# Patient Record
Sex: Female | Born: 1965
Health system: Southern US, Community
[De-identification: ages and names within clinical notes are randomized; demographics above are authoritative.]

## PROBLEM LIST (undated history)

## (undated) DIAGNOSIS — M1990 Unspecified osteoarthritis, unspecified site: Secondary | ICD-10-CM

## (undated) DIAGNOSIS — I10 Essential (primary) hypertension: Secondary | ICD-10-CM

## (undated) DIAGNOSIS — R32 Unspecified urinary incontinence: Secondary | ICD-10-CM

## (undated) DIAGNOSIS — K3184 Gastroparesis: Secondary | ICD-10-CM

## (undated) DIAGNOSIS — K2100 Gastro-esophageal reflux disease with esophagitis, without bleeding: Secondary | ICD-10-CM

## (undated) DIAGNOSIS — Z860101 Personal history of adenomatous and serrated colon polyps: Secondary | ICD-10-CM

## (undated) DIAGNOSIS — R7301 Impaired fasting glucose: Secondary | ICD-10-CM

## (undated) DIAGNOSIS — J45909 Unspecified asthma, uncomplicated: Secondary | ICD-10-CM

## (undated) DIAGNOSIS — E7800 Pure hypercholesterolemia, unspecified: Secondary | ICD-10-CM

## (undated) DIAGNOSIS — E039 Hypothyroidism, unspecified: Secondary | ICD-10-CM

## (undated) DIAGNOSIS — Z86010 Personal history of colonic polyps: Secondary | ICD-10-CM

## (undated) DIAGNOSIS — R3129 Other microscopic hematuria: Secondary | ICD-10-CM

## (undated) DIAGNOSIS — Z8489 Family history of other specified conditions: Secondary | ICD-10-CM

## (undated) HISTORY — DX: Personal history of adenomatous and serrated colon polyps: Z86.0101

## (undated) HISTORY — DX: Unspecified osteoarthritis, unspecified site: M19.90

## (undated) HISTORY — DX: Gastroparesis: K31.84

## (undated) HISTORY — PX: LAPAROSCOPIC ASSISTED VAGINAL HYSTERECTOMY: SHX5398

## (undated) HISTORY — DX: Pure hypercholesterolemia, unspecified: E78.00

## (undated) HISTORY — DX: Unspecified asthma, uncomplicated: J45.909

## (undated) HISTORY — DX: Hypothyroidism, unspecified: E03.9

## (undated) HISTORY — DX: Other microscopic hematuria: R31.29

## (undated) HISTORY — DX: Personal history of colonic polyps: Z86.010

## (undated) HISTORY — DX: Unspecified urinary incontinence: R32

## (undated) HISTORY — DX: Gastro-esophageal reflux disease with esophagitis, without bleeding: K21.00

## (undated) HISTORY — DX: Gastro-esophageal reflux disease with esophagitis: K21.0

## (undated) HISTORY — DX: Impaired fasting glucose: R73.01

## (undated) HISTORY — DX: Essential (primary) hypertension: I10

---

## 2005-01-24 ENCOUNTER — Other Ambulatory Visit: Admission: RE | Admit: 2005-01-24 | Discharge: 2005-01-24 | Payer: Self-pay | Admitting: Family Medicine

## 2006-04-01 ENCOUNTER — Other Ambulatory Visit: Admission: RE | Admit: 2006-04-01 | Discharge: 2006-04-01 | Payer: Self-pay | Admitting: Family Medicine

## 2006-06-03 ENCOUNTER — Ambulatory Visit: Payer: Self-pay | Admitting: Internal Medicine

## 2006-06-04 ENCOUNTER — Ambulatory Visit: Payer: Self-pay | Admitting: Gastroenterology

## 2006-06-10 ENCOUNTER — Ambulatory Visit (HOSPITAL_COMMUNITY): Admission: RE | Admit: 2006-06-10 | Discharge: 2006-06-10 | Payer: Self-pay | Admitting: Internal Medicine

## 2006-07-15 ENCOUNTER — Ambulatory Visit: Payer: Self-pay | Admitting: Internal Medicine

## 2008-07-26 ENCOUNTER — Encounter: Admission: RE | Admit: 2008-07-26 | Discharge: 2008-07-26 | Payer: Self-pay | Admitting: Obstetrics and Gynecology

## 2009-09-12 IMAGING — MG MM DIAGNOSTIC BILATERAL
6 series · 6 of 6 positions shown · non-contrast
Comparison: none

CLINICAL DATA: Focal pain in the 2 o'clock position of the left
breast for 1 week.  The patient states that she felt a small lump
in this area.  The patient was able to express a small amount of
gray greenish discharge 1 week ago.  No spontaneous discharge.

DIGITAL DIAGNOSTIC  BILATERAL  MAMMOGRAM  WITH CAD AND LEFT BREAST
ULTRASOUND:

[R CC]
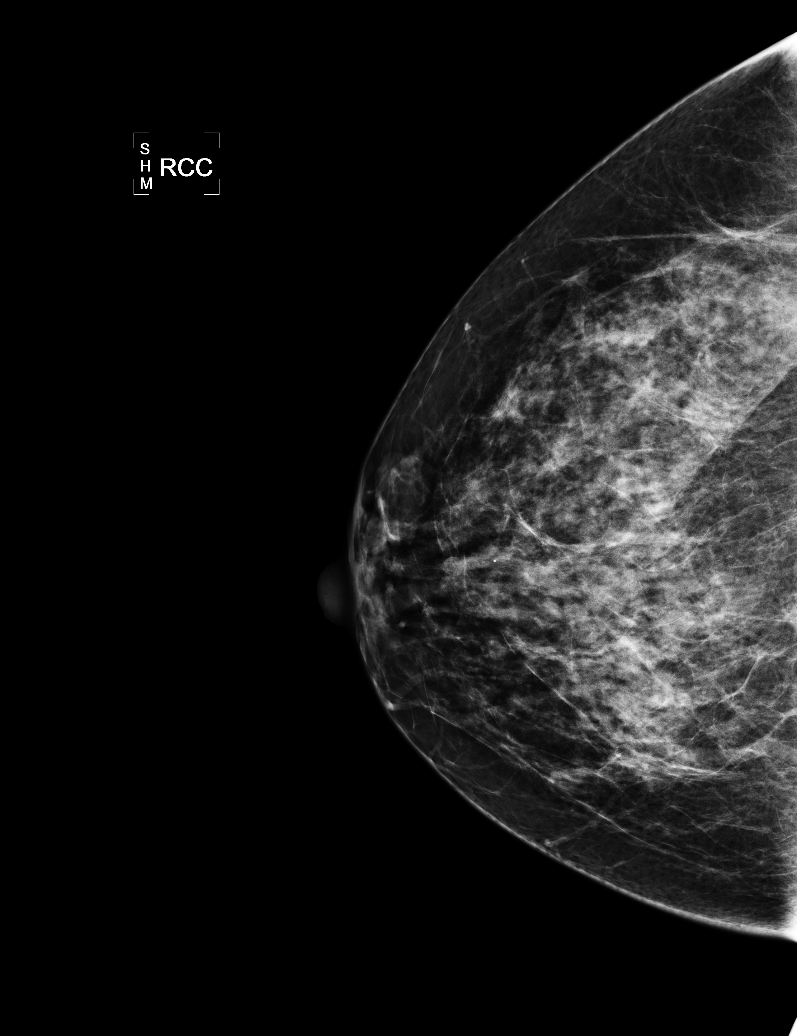

[L CC (1 of 2)]
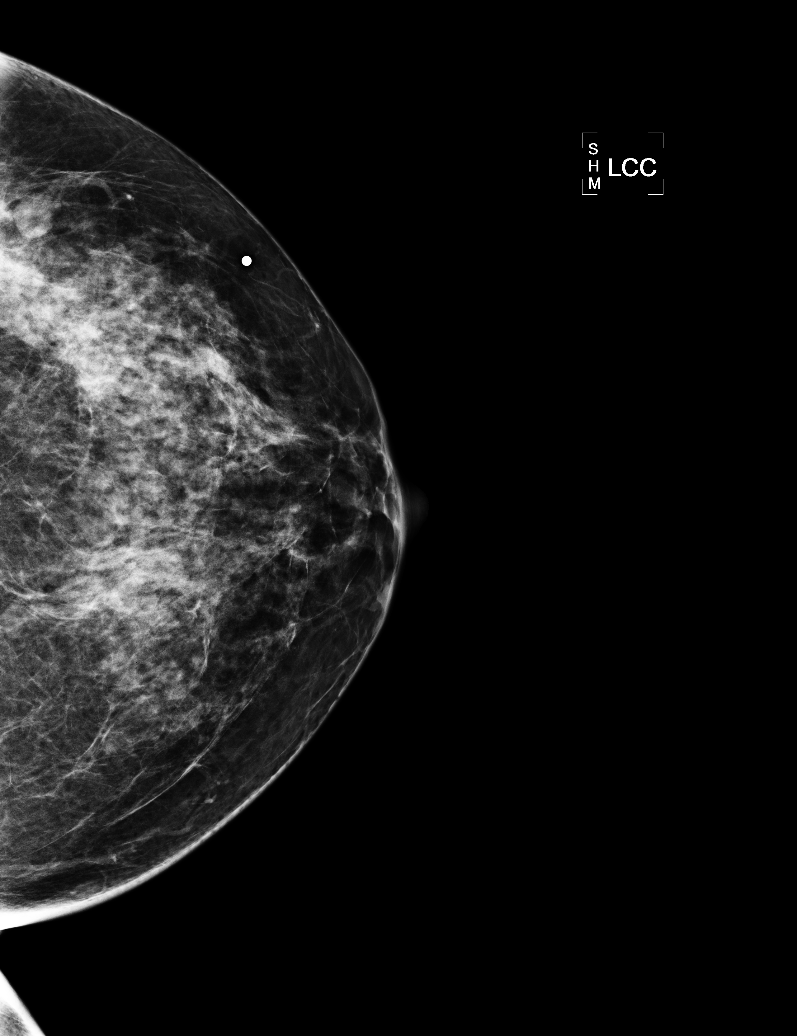

[L MLO (1 of 2)]
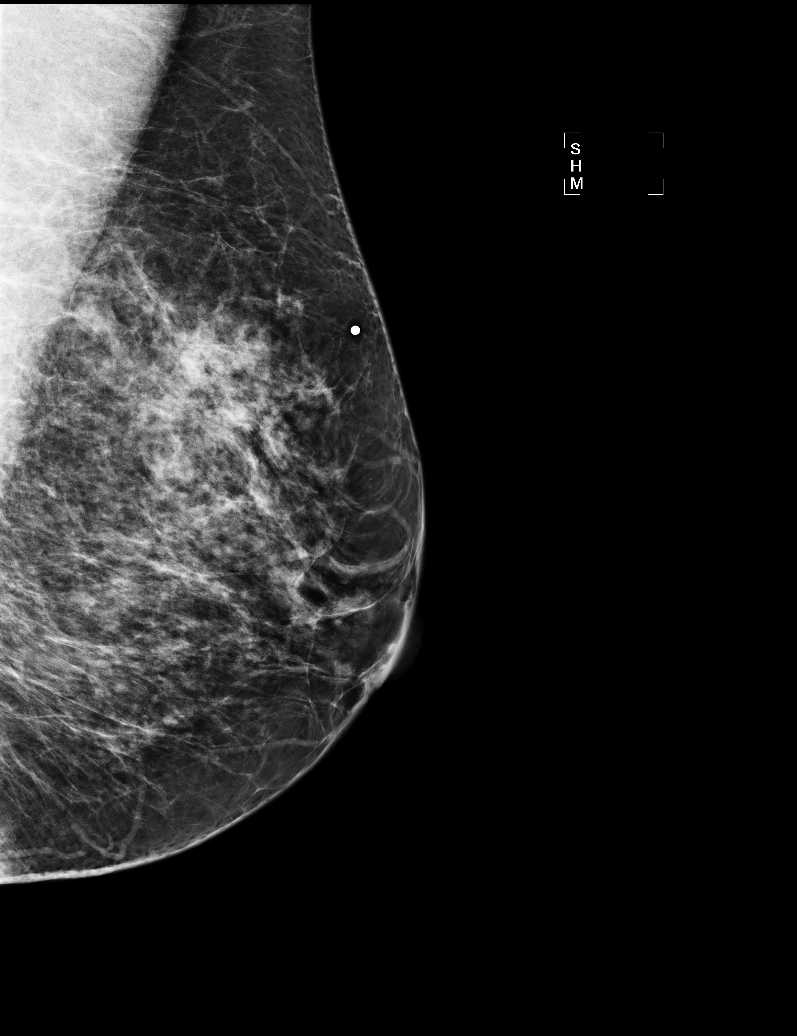

[R MLO]
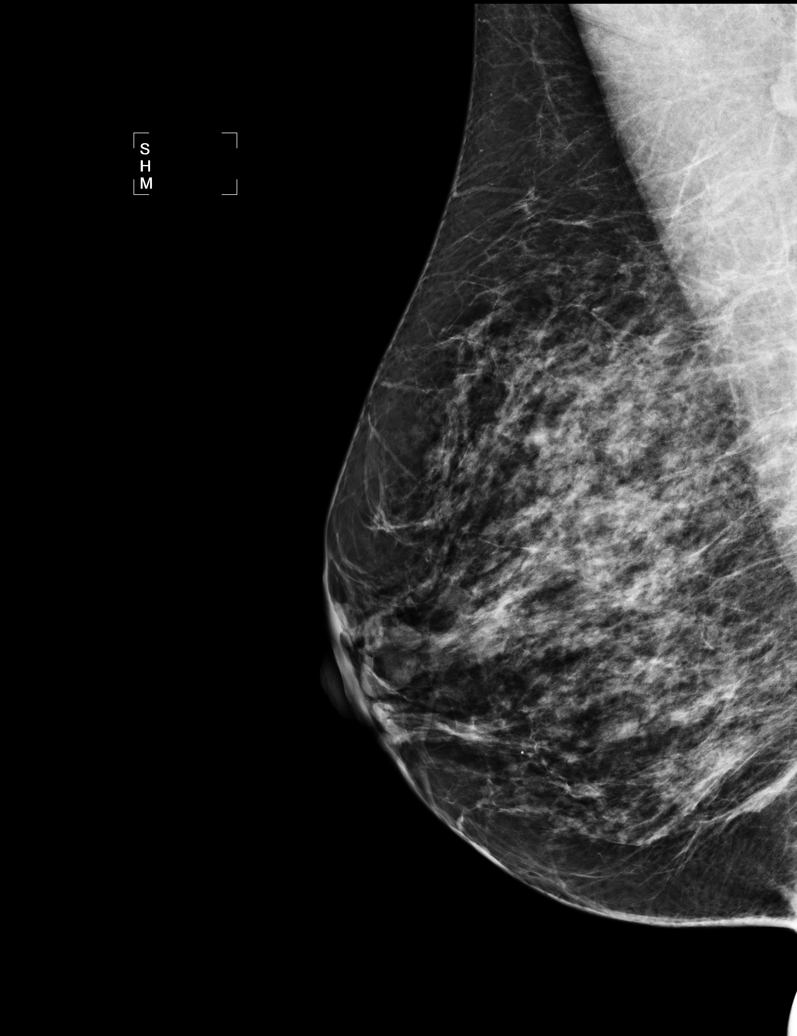

[L CC (2 of 2)]
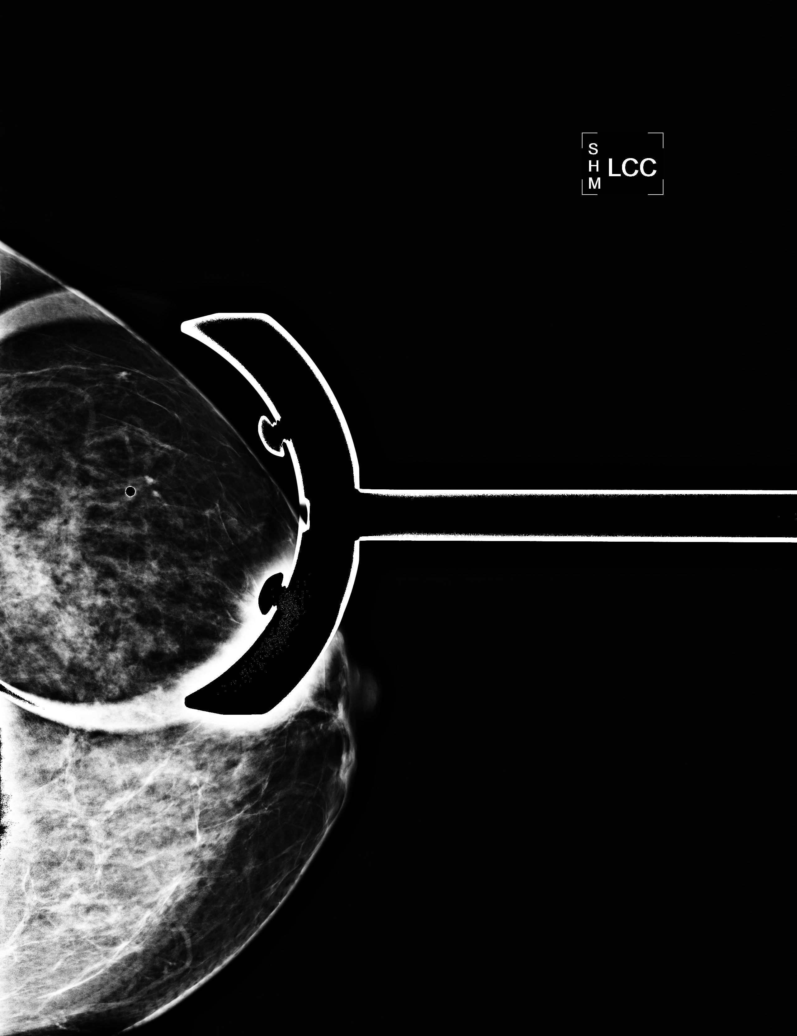

[L MLO (2 of 2)]
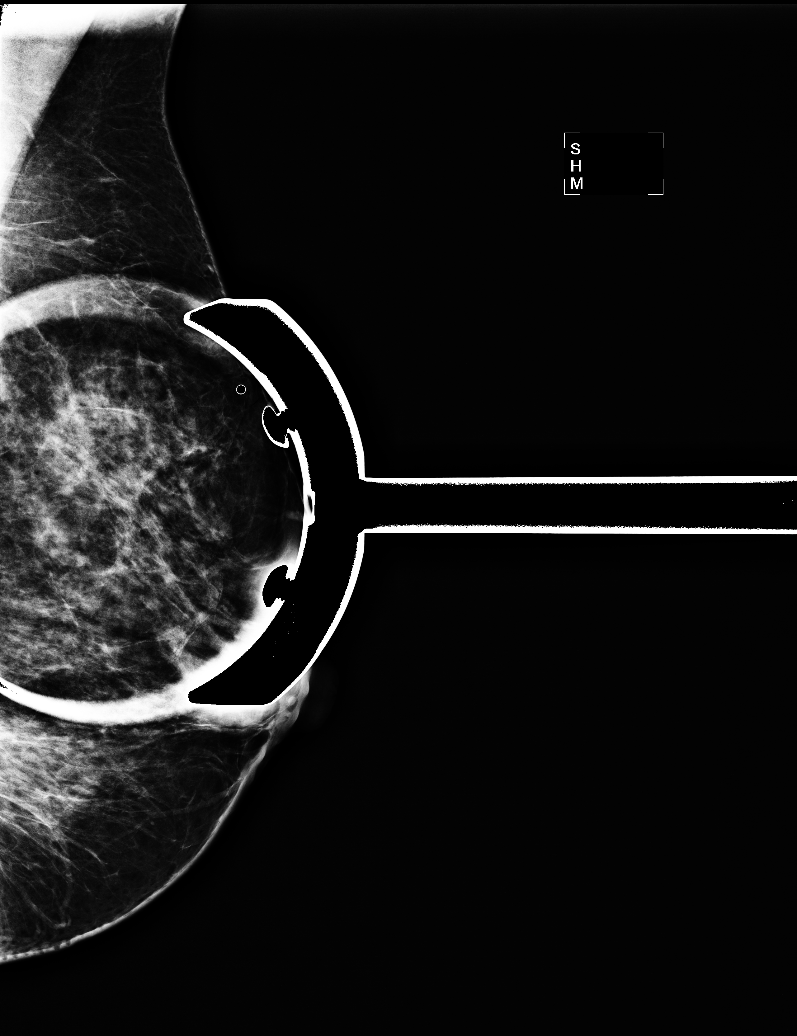

[6 of 6 positions shown; findings below may reference images not displayed]

FINDINGS: The breast tissue is heterogeneously dense.  There is no
dominant mass, architectural distortion or calcification to suggest
malignancy.

On physical exam, no mass is palpated in the left upper outer
quadrant.

Ultrasound is performed, showing dense fibroglandular tissue in the
area of concern at 2 o'clock 7 cm from the left nipple.  There are
subcentimeter cysts within this area but these are not palpable.
There is an intramammary lymph node at 2 o'clock, 10 cm from the
left nipple.
IMPRESSION: No mammographic or sonographic evidence of malignancy.  Yearly
screening mammography is suggested.

BI-RADS CATEGORY 1:  Negative.

## 2011-04-26 NOTE — Assessment & Plan Note (Signed)
[facility]                           GASTROENTEROLOGY OFFICE NOTE   NAME:Heather Buchanan, Heather Buchanan                     MRN:          [id number]  DATE:07/15/2006                            DOB:          [birth year]    See my medical history form for full details.   ASSESSMENT:  1.  Previous diagnosis of gastroparesis.  She presented with epigastric pain      and vomiting.  An ultrasound on June 04, 2006, was normal, and a gastric      emptying study here in [place] was normal as well.  She has been      relatively asymptomatic at this point.  There is a previous history of      sludge on gallbladder ultrasound.  2.  Glucose of 112 fasting on previous labs, could be prediabetes.  A twin      sister is on metformin.   RECOMMENDATIONS AND PLAN:  1.  Repeat a glucose, checking hemoglobin A1C today (these are nonfasting).      Check sprue labs, because she has had diarrhea in some of these issues.      Tissue transglutaminase antibody and IgA antibody.  2.  Return to see me as needed at this point.  We discussed the possibility      of a viral gastroparesis that resolved and improved with time, and also      that this is only one 2-hour study out of her life, and it could be that      she has gastroparesis that just was not manifested on this study.  3.  As far as treatment, she says she will pursue the domperidone rather      than the Reglan, though she could use either on an as-needed basis at      this point, as well as the Compazine I prescribed.  4.  Further plans pending clinical course.  She may need further workup of      possible diabetes, but I will see what these results show and try to      forward those on to Heather Stack, NP.                                   Iva Boop, MD, [facility] Graham   CEG/MedQ  DD:  07/15/2006  DT:  07/16/2006  Job #:  [id number]   cc:   Heather Stack, NP

## 2014-08-24 ENCOUNTER — Other Ambulatory Visit: Payer: Self-pay | Admitting: Obstetrics and Gynecology

## 2014-08-24 DIAGNOSIS — N6452 Nipple discharge: Secondary | ICD-10-CM

## 2014-08-24 DIAGNOSIS — N644 Mastodynia: Secondary | ICD-10-CM

## 2014-08-29 ENCOUNTER — Ambulatory Visit
Admission: RE | Admit: 2014-08-29 | Discharge: 2014-08-29 | Disposition: A | Discharge status: Home / Self Care | Payer: BC Managed Care – PPO | Source: Ambulatory Visit | Attending: Obstetrics and Gynecology | Admitting: Obstetrics and Gynecology

## 2014-08-29 DIAGNOSIS — N6452 Nipple discharge: Secondary | ICD-10-CM

## 2014-08-29 DIAGNOSIS — N644 Mastodynia: Secondary | ICD-10-CM

## 2014-08-31 ENCOUNTER — Inpatient Hospital Stay: Admission: RE | Admit: 2014-08-31 | Payer: Self-pay | Source: Ambulatory Visit

## 2014-08-31 ENCOUNTER — Other Ambulatory Visit: Payer: Self-pay

## 2016-06-07 ENCOUNTER — Other Ambulatory Visit: Payer: Self-pay | Admitting: Obstetrics and Gynecology

## 2016-06-07 DIAGNOSIS — N644 Mastodynia: Secondary | ICD-10-CM

## 2016-06-13 ENCOUNTER — Ambulatory Visit
Admission: RE | Admit: 2016-06-13 | Discharge: 2016-06-13 | Disposition: A | Discharge status: Home / Self Care | Payer: Commercial Managed Care - HMO | Source: Ambulatory Visit | Attending: Obstetrics and Gynecology | Admitting: Obstetrics and Gynecology

## 2016-06-13 DIAGNOSIS — N644 Mastodynia: Secondary | ICD-10-CM

## 2017-01-29 DIAGNOSIS — Z6828 Body mass index (BMI) 28.0-28.9, adult: Secondary | ICD-10-CM | POA: Diagnosis not present

## 2017-01-29 DIAGNOSIS — Z01419 Encounter for gynecological examination (general) (routine) without abnormal findings: Secondary | ICD-10-CM | POA: Diagnosis not present

## 2017-01-29 DIAGNOSIS — R319 Hematuria, unspecified: Secondary | ICD-10-CM | POA: Diagnosis not present

## 2017-02-03 DIAGNOSIS — R110 Nausea: Secondary | ICD-10-CM | POA: Diagnosis not present

## 2017-02-03 DIAGNOSIS — M545 Low back pain: Secondary | ICD-10-CM | POA: Diagnosis not present

## 2017-02-03 DIAGNOSIS — R310 Gross hematuria: Secondary | ICD-10-CM | POA: Diagnosis not present

## 2017-02-10 DIAGNOSIS — Z13228 Encounter for screening for other metabolic disorders: Secondary | ICD-10-CM | POA: Diagnosis not present

## 2017-02-10 DIAGNOSIS — Z131 Encounter for screening for diabetes mellitus: Secondary | ICD-10-CM | POA: Diagnosis not present

## 2017-02-10 DIAGNOSIS — E785 Hyperlipidemia, unspecified: Secondary | ICD-10-CM | POA: Diagnosis not present

## 2017-02-10 DIAGNOSIS — Z780 Asymptomatic menopausal state: Secondary | ICD-10-CM | POA: Diagnosis not present

## 2017-02-13 DIAGNOSIS — F4321 Adjustment disorder with depressed mood: Secondary | ICD-10-CM | POA: Diagnosis not present

## 2017-02-20 DIAGNOSIS — F4321 Adjustment disorder with depressed mood: Secondary | ICD-10-CM | POA: Diagnosis not present

## 2017-03-06 DIAGNOSIS — F4321 Adjustment disorder with depressed mood: Secondary | ICD-10-CM | POA: Diagnosis not present

## 2017-03-27 DIAGNOSIS — F4321 Adjustment disorder with depressed mood: Secondary | ICD-10-CM | POA: Diagnosis not present

## 2017-07-23 DIAGNOSIS — Z1231 Encounter for screening mammogram for malignant neoplasm of breast: Secondary | ICD-10-CM | POA: Diagnosis not present

## 2017-07-25 LAB — HM MAMMOGRAPHY

## 2017-08-04 DIAGNOSIS — F4323 Adjustment disorder with mixed anxiety and depressed mood: Secondary | ICD-10-CM | POA: Diagnosis not present

## 2017-08-18 DIAGNOSIS — F4323 Adjustment disorder with mixed anxiety and depressed mood: Secondary | ICD-10-CM | POA: Diagnosis not present

## 2017-09-01 DIAGNOSIS — F4323 Adjustment disorder with mixed anxiety and depressed mood: Secondary | ICD-10-CM | POA: Diagnosis not present

## 2017-10-13 DIAGNOSIS — F4323 Adjustment disorder with mixed anxiety and depressed mood: Secondary | ICD-10-CM | POA: Diagnosis not present

## 2017-11-03 DIAGNOSIS — F4323 Adjustment disorder with mixed anxiety and depressed mood: Secondary | ICD-10-CM | POA: Diagnosis not present

## 2017-11-20 DIAGNOSIS — F4323 Adjustment disorder with mixed anxiety and depressed mood: Secondary | ICD-10-CM | POA: Diagnosis not present

## 2018-01-01 DIAGNOSIS — F4323 Adjustment disorder with mixed anxiety and depressed mood: Secondary | ICD-10-CM | POA: Diagnosis not present

## 2018-01-29 DIAGNOSIS — F4323 Adjustment disorder with mixed anxiety and depressed mood: Secondary | ICD-10-CM | POA: Diagnosis not present

## 2018-02-10 DIAGNOSIS — E039 Hypothyroidism, unspecified: Secondary | ICD-10-CM | POA: Diagnosis not present

## 2018-02-26 DIAGNOSIS — F4323 Adjustment disorder with mixed anxiety and depressed mood: Secondary | ICD-10-CM | POA: Diagnosis not present

## 2018-04-02 DIAGNOSIS — F4323 Adjustment disorder with mixed anxiety and depressed mood: Secondary | ICD-10-CM | POA: Diagnosis not present

## 2018-04-15 DIAGNOSIS — Z6827 Body mass index (BMI) 27.0-27.9, adult: Secondary | ICD-10-CM | POA: Diagnosis not present

## 2018-04-15 DIAGNOSIS — Z01419 Encounter for gynecological examination (general) (routine) without abnormal findings: Secondary | ICD-10-CM | POA: Diagnosis not present

## 2018-04-15 LAB — LIPID PANEL
CHOLESTEROL: 228 — AB (ref 0–200)
HDL: 50 (ref 35–70)
LDL Cholesterol: 157
Triglycerides: 99 (ref 40–160)

## 2018-04-15 LAB — BASIC METABOLIC PANEL: Glucose: 102

## 2018-04-16 LAB — HM PAP SMEAR: HM Pap smear: NORMAL

## 2018-04-21 DIAGNOSIS — F4323 Adjustment disorder with mixed anxiety and depressed mood: Secondary | ICD-10-CM | POA: Diagnosis not present

## 2018-04-22 DIAGNOSIS — Z780 Asymptomatic menopausal state: Secondary | ICD-10-CM | POA: Diagnosis not present

## 2018-04-22 DIAGNOSIS — E0590 Thyrotoxicosis, unspecified without thyrotoxic crisis or storm: Secondary | ICD-10-CM | POA: Diagnosis not present

## 2018-05-05 DIAGNOSIS — Z1211 Encounter for screening for malignant neoplasm of colon: Secondary | ICD-10-CM | POA: Diagnosis not present

## 2018-05-09 HISTORY — PX: COLONOSCOPY: SHX174

## 2018-05-21 DIAGNOSIS — F4323 Adjustment disorder with mixed anxiety and depressed mood: Secondary | ICD-10-CM | POA: Diagnosis not present

## 2018-06-08 DIAGNOSIS — R3129 Other microscopic hematuria: Secondary | ICD-10-CM

## 2018-06-08 HISTORY — DX: Other microscopic hematuria: R31.29

## 2018-06-09 DIAGNOSIS — N393 Stress incontinence (female) (male): Secondary | ICD-10-CM | POA: Diagnosis not present

## 2018-06-09 DIAGNOSIS — R311 Benign essential microscopic hematuria: Secondary | ICD-10-CM | POA: Diagnosis not present

## 2018-06-23 DIAGNOSIS — R3129 Other microscopic hematuria: Secondary | ICD-10-CM | POA: Diagnosis not present

## 2018-06-23 DIAGNOSIS — R311 Benign essential microscopic hematuria: Secondary | ICD-10-CM | POA: Diagnosis not present

## 2018-07-06 ENCOUNTER — Encounter: Payer: Self-pay | Admitting: Family Medicine

## 2018-07-06 ENCOUNTER — Ambulatory Visit: Payer: BLUE CROSS/BLUE SHIELD | Admitting: Family Medicine

## 2018-07-06 VITALS — BP 156/81 | HR 88 | Temp 98.5°F | Resp 16 | Ht 61.75 in | Wt 149.0 lb

## 2018-07-06 DIAGNOSIS — R7301 Impaired fasting glucose: Secondary | ICD-10-CM

## 2018-07-06 DIAGNOSIS — I10 Essential (primary) hypertension: Secondary | ICD-10-CM

## 2018-07-06 DIAGNOSIS — E663 Overweight: Secondary | ICD-10-CM

## 2018-07-06 DIAGNOSIS — E7800 Pure hypercholesterolemia, unspecified: Secondary | ICD-10-CM | POA: Diagnosis not present

## 2018-07-06 MED ORDER — LISINOPRIL 5 MG PO TABS: 5.0000 mg | ORAL_TABLET | Freq: Every day | ORAL | 0 refills | Status: DC

## 2018-07-06 NOTE — Patient Instructions (Signed)
DASH Eating Plan DASH stands for "Dietary Approaches to Stop Hypertension." The DASH eating plan is a healthy eating plan that has been shown to reduce high blood pressure (hypertension). It may also reduce your risk for type 2 diabetes, heart disease, and stroke. The DASH eating plan may also help with weight loss. What are tips for following this plan? General guidelines  Avoid eating more than 2,300 mg (milligrams) of salt (sodium) a day. If you have hypertension, you may need to reduce your sodium intake to 1,500 mg a day.  Limit alcohol intake to no more than 1 drink a day for nonpregnant women and 2 drinks a day for men. One drink equals 12 oz of beer, 5 oz of wine, or 1 oz of hard liquor.  Work with your health care provider to maintain a healthy body weight or to lose weight. Ask what an ideal weight is for you.  Get at least 30 minutes of exercise that causes your heart to beat faster (aerobic exercise) most days of the week. Activities may include walking, swimming, or biking.  Work with your health care provider or diet and nutrition specialist (dietitian) to adjust your eating plan to your individual calorie needs. Reading food labels  Check food labels for the amount of sodium per serving. Choose foods with less than 5 percent of the Daily Value of sodium. Generally, foods with less than 300 mg of sodium per serving fit into this eating plan.  To find whole grains, look for the word "whole" as the first word in the ingredient list. Shopping  Buy products labeled as "low-sodium" or "no salt added."  Buy fresh foods. Avoid canned foods and premade or frozen meals. Cooking  Avoid adding salt when cooking. Use salt-free seasonings or herbs instead of table salt or sea salt. Check with your health care provider or pharmacist before using salt substitutes.  Do not fry foods. Cook foods using healthy methods such as baking, boiling, grilling, and broiling instead.  Cook with  heart-healthy oils, such as olive, canola, soybean, or sunflower oil. Meal planning   Eat a balanced diet that includes: ? 5 or more servings of fruits and vegetables each day. At each meal, try to fill half of your plate with fruits and vegetables. ? Up to 6-8 servings of whole grains each day. ? Less than 6 oz of lean meat, poultry, or fish each day. A 3-oz serving of meat is about the same size as a deck of cards. One egg equals 1 oz. ? 2 servings of low-fat dairy each day. ? A serving of nuts, seeds, or beans 5 times each week. ? Heart-healthy fats. Healthy fats called Omega-3 fatty acids are found in foods such as flaxseeds and coldwater fish, like sardines, salmon, and mackerel.  Limit how much you eat of the following: ? Canned or prepackaged foods. ? Food that is high in trans fat, such as fried foods. ? Food that is high in saturated fat, such as fatty meat. ? Sweets, desserts, sugary drinks, and other foods with added sugar. ? Full-fat dairy products.  Do not salt foods before eating.  Try to eat at least 2 vegetarian meals each week.  Eat more home-cooked food and less restaurant, buffet, and fast food.  When eating at a restaurant, ask that your food be prepared with less salt or no salt, if possible. What foods are recommended? The items listed may not be a complete list. Talk with your dietitian about what   dietary choices are best for you. Grains Whole-grain or whole-wheat bread. Whole-grain or whole-wheat pasta. Brown rice. Oatmeal. Quinoa. Bulgur. Whole-grain and low-sodium cereals. Pita bread. Low-fat, low-sodium crackers. Whole-wheat flour tortillas. Vegetables Fresh or frozen vegetables (raw, steamed, roasted, or grilled). Low-sodium or reduced-sodium tomato and vegetable juice. Low-sodium or reduced-sodium tomato sauce and tomato paste. Low-sodium or reduced-sodium canned vegetables. Fruits All fresh, dried, or frozen fruit. Canned fruit in natural juice (without  added sugar). Meat and other protein foods Skinless chicken or turkey. Ground chicken or turkey. Pork with fat trimmed off. Fish and seafood. Egg whites. Dried beans, peas, or lentils. Unsalted nuts, nut butters, and seeds. Unsalted canned beans. Lean cuts of beef with fat trimmed off. Low-sodium, lean deli meat. Dairy Low-fat (1%) or fat-free (skim) milk. Fat-free, low-fat, or reduced-fat cheeses. Nonfat, low-sodium ricotta or cottage cheese. Low-fat or nonfat yogurt. Low-fat, low-sodium cheese. Fats and oils Soft margarine without trans fats. Vegetable oil. Low-fat, reduced-fat, or light mayonnaise and salad dressings (reduced-sodium). Canola, safflower, olive, soybean, and sunflower oils. Avocado. Seasoning and other foods Herbs. Spices. Seasoning mixes without salt. Unsalted popcorn and pretzels. Fat-free sweets. What foods are not recommended? The items listed may not be a complete list. Talk with your dietitian about what dietary choices are best for you. Grains Baked goods made with fat, such as croissants, muffins, or some breads. Dry pasta or rice meal packs. Vegetables Creamed or fried vegetables. Vegetables in a cheese sauce. Regular canned vegetables (not low-sodium or reduced-sodium). Regular canned tomato sauce and paste (not low-sodium or reduced-sodium). Regular tomato and vegetable juice (not low-sodium or reduced-sodium). Pickles. Olives. Fruits Canned fruit in a light or heavy syrup. Fried fruit. Fruit in cream or butter sauce. Meat and other protein foods Fatty cuts of meat. Ribs. Fried meat. Bacon. Sausage. Bologna and other processed lunch meats. Salami. Fatback. Hotdogs. Bratwurst. Salted nuts and seeds. Canned beans with added salt. Canned or smoked fish. Whole eggs or egg yolks. Chicken or turkey with skin. Dairy Whole or 2% milk, cream, and half-and-half. Whole or full-fat cream cheese. Whole-fat or sweetened yogurt. Full-fat cheese. Nondairy creamers. Whipped toppings.  Processed cheese and cheese spreads. Fats and oils Butter. Stick margarine. Lard. Shortening. Ghee. Bacon fat. Tropical oils, such as coconut, palm kernel, or palm oil. Seasoning and other foods Salted popcorn and pretzels. Onion salt, garlic salt, seasoned salt, table salt, and sea salt. Worcestershire sauce. Tartar sauce. Barbecue sauce. Teriyaki sauce. Soy sauce, including reduced-sodium. Steak sauce. Canned and packaged gravies. Fish sauce. Oyster sauce. Cocktail sauce. Horseradish that you find on the shelf. Ketchup. Mustard. Meat flavorings and tenderizers. Bouillon cubes. Hot sauce and Tabasco sauce. Premade or packaged marinades. Premade or packaged taco seasonings. Relishes. Regular salad dressings. Where to find more information:  National Heart, Lung, and Blood Institute: www.nhlbi.nih.gov  American Heart Association: www.heart.org Summary  The DASH eating plan is a healthy eating plan that has been shown to reduce high blood pressure (hypertension). It may also reduce your risk for type 2 diabetes, heart disease, and stroke.  With the DASH eating plan, you should limit salt (sodium) intake to 2,300 mg a day. If you have hypertension, you may need to reduce your sodium intake to 1,500 mg a day.  When on the DASH eating plan, aim to eat more fresh fruits and vegetables, whole grains, lean proteins, low-fat dairy, and heart-healthy fats.  Work with your health care provider or diet and nutrition specialist (dietitian) to adjust your eating plan to your individual   calorie needs. This information is not intended to replace advice given to you by your health care provider. Make sure you discuss any questions you have with your health care provider. Document Released: 11/14/2011 Document Revised: 11/18/2016 Document Reviewed: 11/18/2016 Elsevier Interactive Patient Education  2018 Elsevier Inc.  

## 2018-07-06 NOTE — Progress Notes (Addendum)
Office Note 07/06/2018  CC:  Chief Complaint  Patient presents with  . Establish Care    Previous PCP: None  . Hypertension    HPI:  Heather Buchanan is a 52 y.o.  female who is here to establish care and discuss high blood pressure. Patient's most recent primary MD: none (has been seeing gYN Dr. Rana Snare, though). Old records were not reviewed prior to or during today's visit.  Has noted blood pressure elevated at GYN visits last couple of years.  Sounds like mildly elevated fasting gluc's and borderline hyperlipid numbers as well.  She recalls being told by Dr. Rana Snare that she needs to establish with a PCP to get these things addressed. She brought in recent [facility] labs done through employer about 2-3 mo ago: gluc 102, T chol 228, LDL 157, Trigs 99, HDL 50, chol/hdl ratio 4.6.  BP 140/86.  Never on bp med.  Two cups coffee each morning.  OTC decongestant only as needed. +FH HTN.  Feels hot and with HA when bp is up.  Lots of stress in home life, separation.  Also feeling menopausal hot flashes. Checking bp at home and work: 140s/90s is highest she's seen.   Exercise: none, but she is not sedentary. Diet: avoids sodium.  Fast food 2 x/mo, no fried/fatty foods, eats pretty healthy. Lots of grains.  Also, has had microhematuria recently at GYN, and has just seen a urologist, has f/u with Dr. Alvester Morin tomorrow. She got CT scan 2 wks ago and will discuss results tomorrow.  Past Medical History:  Diagnosis Date  . Arthritis   . Asthmatic bronchitis    "every winter"  . Gastroparesis    dietary changes help.  Stress worsens it.  Marland Kitchen GERD with esophagitis    2003 bx.  Gastric--no H pylori. SMall bowel and rectum bx: mild inflammatory cell infiltrate but no other abnormalities:   . History of adenomatous polyp of colon    Most recent colonoscopy normal 05/2018--recall 10 yrs per pt (Dr. Kinnie Scales).  . Hypercholesteremia   . Hypertension   . Hypothyroidism   . Prediabetes   . Urine  incontinence     Past Surgical History:  Procedure Laterality Date  . CESAREAN SECTION  2003 and 2004  . COLONOSCOPY  05/2018    Family History  Problem Relation Age of Onset  . Arthritis Mother   . Hyperlipidemia Mother   . Hypertension Mother   . Hearing loss Father   . Depression Sister   . Bipolar disorder Sister     Social History   Socioeconomic History  . Marital status: Married    Spouse name: Not on file  . Number of children: Not on file  . Years of education: Not on file  . Highest education level: Not on file  Occupational History  . Not on file  Social Needs  . Financial resource strain: Not on file  . Food insecurity:    Worry: Not on file    Inability: Not on file  . Transportation needs:    Medical: Not on file    Non-medical: Not on file  Tobacco Use  . Smoking status: Never Smoker  . Smokeless tobacco: Never Used  Substance and Sexual Activity  . Alcohol use: Yes  . Drug use: Never  . Sexual activity: Not Currently  Lifestyle  . Physical activity:    Days per week: Not on file    Minutes per session: Not on file  . Stress: Not on file  Relationships  . Social connections:    Talks on phone: Not on file    Gets together: Not on file    Attends religious service: Not on file    Active member of club or organization: Not on file    Attends meetings of clubs or organizations: Not on file    Relationship status: Not on file  . Intimate partner violence:    Fear of current or ex partner: Not on file    Emotionally abused: Not on file    Physically abused: Not on file    Forced sexual activity: Not on file  Other Topics Concern  . Not on file  Social History Narrative   Married, 4 children.  All [facility].   She has identical twin--HTN, DM, obesity.   Orig from [place].   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   No tob.   Alc: occ.    Outpatient Encounter Medications as of 07/06/2018  Medication Sig  . BLISOVI  24 FE 1-20 MG-MCG(24) tablet Take 1 tablet by mouth daily.  Marland Kitchen buPROPion (WELLBUTRIN XL) 300 MG 24 hr tablet Take 1 tablet by mouth daily.  Marland Kitchen levothyroxine (SYNTHROID, LEVOTHROID) 88 MCG tablet Take 88 mcg by mouth daily.  . sertraline (ZOLOFT) 100 MG tablet Take 100 mg by mouth daily.  Marland Kitchen lisinopril (PRINIVIL,ZESTRIL) 5 MG tablet Take 1 tablet (5 mg total) by mouth daily.   No facility-administered encounter medications on file as of 07/06/2018.     Allergies  Allergen Reactions  . Barbiturates Other (See Comments)    Porphyria  . Codeine Nausea And Vomiting    ROS Review of Systems  Constitutional: Negative for fatigue and fever.  HENT: Negative for congestion and sore throat.   Eyes: Negative for visual disturbance.  Respiratory: Negative for cough, chest tightness and shortness of breath.   Cardiovascular: Negative for chest pain, palpitations and leg swelling.  Gastrointestinal: Negative for abdominal pain and nausea.  Endocrine: Negative for polydipsia, polyphagia and polyuria.  Genitourinary: Negative for dysuria.  Musculoskeletal: Negative for back pain and joint swelling.  Skin: Negative for rash.  Neurological: Negative for weakness and headaches.  Hematological: Negative for adenopathy.    PE; Blood pressure (!) 156/81, pulse 88, temperature 98.5 F (36.9 C), temperature source Oral, resp. rate 16, height 5' 1.75" (1.568 m), weight 149 lb (67.6 kg), SpO2 98 %. Body mass index is 27.47 kg/m. Gen: Alert, well appearing.  Patient is oriented to person, place, time, and situation. AFFECT: pleasant, lucid thought and speech. Neck - No masses or thyromegaly or limitation in range of motion CV: RRR, no m/r/g.   LUNGS: CTA bilat, nonlabored resps, good aeration in all lung fields. EXT: no clubbing, cyanosis, or edema.   Pertinent labs:  No results found for: TSH No results found for: WBC, HGB, HCT, MCV, PLT No results found for: CREATININE, BUN, NA, K, CL, CO2 No  results found for: ALT, AST, GGT, ALKPHOS, BILITOT No results found for: CHOL No results found for: HDL No results found for: LDLCALC No results found for: TRIG No results found for: CHOLHDL No results found for: NFAO1H   ASSESSMENT AND PLAN:   New pt: no prior PCP records to obtain.  Will get last 2 GYN office notes.  1) HTN: discussed dx, general treatment options. Start lisinopril 5mg , 1 tab qd. Therapeutic expectations and side effect profile of medication discussed today.  Patient's questions answered. DASH eating plan discussed and given  to pt today. Discussed need for general TLC, including dietary mod, exercise recommendations, and wt loss.  2) IFG; mild.  +FH DM (identical twin sister). We'll check fasting HP labs at next f/u visit in 2 wks.  3) Borderline hyperlipidemia: diet and exercise discussion as noted above. Labs in future as stated above.  4) Microhematuria, new dx at last GYN f/u:  She has had one visit with Urol. Goes back to see urol (Dr. Alvester MorinBell) tomorrow in order to review CT results and discuss further plans.  An After Visit Summary was printed and given to the patient.  Return in about 2 weeks (around 07/20/2018) for fasting f/u HTN/IFG/borderline HLD.  Signed:  Santiago BumpersPhil Florencio Hollibaugh, MD           07/06/2018

## 2018-07-07 ENCOUNTER — Encounter: Payer: Self-pay | Admitting: Family Medicine

## 2018-07-07 DIAGNOSIS — N393 Stress incontinence (female) (male): Secondary | ICD-10-CM | POA: Diagnosis not present

## 2018-07-07 DIAGNOSIS — R311 Benign essential microscopic hematuria: Secondary | ICD-10-CM | POA: Diagnosis not present

## 2018-07-09 DIAGNOSIS — R7301 Impaired fasting glucose: Secondary | ICD-10-CM

## 2018-07-09 DIAGNOSIS — E7800 Pure hypercholesterolemia, unspecified: Secondary | ICD-10-CM

## 2018-07-09 HISTORY — DX: Pure hypercholesterolemia, unspecified: E78.00

## 2018-07-09 HISTORY — DX: Impaired fasting glucose: R73.01

## 2018-07-14 ENCOUNTER — Other Ambulatory Visit: Payer: Self-pay | Admitting: Family Medicine

## 2018-07-16 ENCOUNTER — Other Ambulatory Visit: Payer: Self-pay | Admitting: Family Medicine

## 2018-07-16 ENCOUNTER — Ambulatory Visit: Payer: Self-pay | Admitting: *Deleted

## 2018-07-16 DIAGNOSIS — F4323 Adjustment disorder with mixed anxiety and depressed mood: Secondary | ICD-10-CM | POA: Diagnosis not present

## 2018-07-16 NOTE — Telephone Encounter (Signed)
Don't take lisinopril anymore. Continue to monitor blood pressure and her current symptoms. If she gets any swelling of tongue, lips, throat, or feels like she is having wheezing or shortness of breath she needs to call 911 or go to nearest emergency room. Otherwise, this does not sound like anything that she needs to be urgently evaluated for in the office.  Have her call office in 5-7d to report to nurse if she is still having any symptoms AND report BPs. I can start new bp med at that time.   Remind her that she always has the option of coming in the office to see me if she would feel more comfortable with that. Also, ask her to ask her twin sister any meds she (the sister) is allergic to.  This may help us avoid potential allergic reactions to any meds in the future.-thx

## 2018-07-16 NOTE — Telephone Encounter (Signed)
Patient notified and verbalized understanding. Patient stated that she has an appointment 07/21/18.

## 2018-07-16 NOTE — Telephone Encounter (Signed)
Patient began taking Lisinopril 5 MG daily on 07/06/18.After one week on the medication, she is experiencing "my mouth is extremely dry but producing a lot of saliva". Stated her mouth feels like she has eaten a bag of potato chips. Had several mouth sores after starting lisinopril that are healing now. Tongue feels different than usual and the glands under her jaw are swollen. Denies illness/fever/sore throat/difficulty swallowing/CP/SOB/rash. Advised to hold medication until she hears back from a nurse. [facility] Please advise Reason for Disposition . Caller has URGENT medication question about med that PCP prescribed and triager unable to answer question  Answer Assessment - Initial Assessment Questions 1. SYMPTOMS: "Do you have any symptoms?"     Mouth is producing saliva but feels very dry at the same time. Tongue feels different. 2. SEVERITY: If symptoms are present, ask "Are they mild, moderate or severe?"     mild  Protocols used: MEDICATION QUESTION CALL-A-AH

## 2018-07-21 ENCOUNTER — Encounter: Payer: Self-pay | Admitting: Family Medicine

## 2018-07-21 ENCOUNTER — Ambulatory Visit: Payer: BLUE CROSS/BLUE SHIELD | Admitting: Family Medicine

## 2018-07-21 VITALS — BP 113/70 | HR 82 | Temp 98.5°F | Resp 16 | Ht 61.75 in | Wt 146.0 lb

## 2018-07-21 DIAGNOSIS — I10 Essential (primary) hypertension: Secondary | ICD-10-CM

## 2018-07-21 DIAGNOSIS — E663 Overweight: Secondary | ICD-10-CM

## 2018-07-21 DIAGNOSIS — E039 Hypothyroidism, unspecified: Secondary | ICD-10-CM

## 2018-07-21 DIAGNOSIS — R7301 Impaired fasting glucose: Secondary | ICD-10-CM | POA: Diagnosis not present

## 2018-07-21 DIAGNOSIS — Z114 Encounter for screening for human immunodeficiency virus [HIV]: Secondary | ICD-10-CM | POA: Diagnosis not present

## 2018-07-21 DIAGNOSIS — E7800 Pure hypercholesterolemia, unspecified: Secondary | ICD-10-CM | POA: Diagnosis not present

## 2018-07-21 LAB — COMPREHENSIVE METABOLIC PANEL
ALT: 10 U/L (ref 0–35)
AST: 11 U/L (ref 0–37)
Albumin: 4.4 g/dL (ref 3.5–5.2)
Alkaline Phosphatase: 67 U/L (ref 39–117)
BUN: 18 mg/dL (ref 6–23)
CALCIUM: 9.6 mg/dL (ref 8.4–10.5)
CO2: 30 mEq/L (ref 19–32)
Chloride: 105 mEq/L (ref 96–112)
Creatinine, Ser: 0.82 mg/dL (ref 0.40–1.20)
GFR: 78.23 mL/min (ref >60.00)
Glucose, Bld: 107 mg/dL — ABNORMAL HIGH (ref 70–99)
POTASSIUM: 4.6 meq/L (ref 3.5–5.1)
Sodium: 140 mEq/L (ref 135–145)
TOTAL PROTEIN: 7.1 g/dL (ref 6.0–8.3)
Total Bilirubin: 0.5 mg/dL (ref 0.2–1.2)

## 2018-07-21 LAB — LIPID PANEL
CHOL/HDL RATIO: 5
Cholesterol: 244 mg/dL — ABNORMAL HIGH (ref 0–200)
HDL: 50.5 mg/dL (ref >39.00)
LDL Cholesterol: 165 mg/dL — ABNORMAL HIGH (ref 0–99)
NONHDL: 193.64
TRIGLYCERIDES: 143 mg/dL (ref 0.0–149.0)
VLDL: 28.6 mg/dL (ref 0.0–40.0)

## 2018-07-21 LAB — CBC WITH DIFFERENTIAL/PLATELET
Basophils Absolute: 0 10*3/uL (ref 0.0–0.1)
Basophils Relative: 0.5 % (ref 0.0–3.0)
Eosinophils Absolute: 0.1 10*3/uL (ref 0.0–0.7)
Eosinophils Relative: 1.9 % (ref 0.0–5.0)
HEMATOCRIT: 41.6 % (ref 36.0–46.0)
HEMOGLOBIN: 14.1 g/dL (ref 12.0–15.0)
LYMPHS PCT: 30.3 % (ref 12.0–46.0)
Lymphs Abs: 1.5 10*3/uL (ref 0.7–4.0)
MCHC: 34 g/dL (ref 30.0–36.0)
MCV: 93.8 fl (ref 78.0–100.0)
MONO ABS: 0.4 10*3/uL (ref 0.1–1.0)
Monocytes Relative: 7.2 % (ref 3.0–12.0)
Neutro Abs: 3 10*3/uL (ref 1.4–7.7)
Neutrophils Relative %: 60.1 % (ref 43.0–77.0)
Platelets: 242 10*3/uL (ref 150.0–400.0)
RBC: 4.44 Mil/uL (ref 3.87–5.11)
RDW: 12.5 % (ref 11.5–15.5)
WBC: 5 10*3/uL (ref 4.0–10.5)

## 2018-07-21 LAB — HEMOGLOBIN A1C: HEMOGLOBIN A1C: 5.5 % (ref 4.6–6.5)

## 2018-07-21 LAB — TSH: TSH: 1.87 u[IU]/mL (ref 0.35–4.50)

## 2018-07-21 NOTE — Progress Notes (Signed)
OFFICE VISIT  07/21/2018   CC:  Chief Complaint  Patient presents with  . Follow-up    RCI, pt is fasting.    HPI:    Patient is a 52 y.o. Caucasian female who presents for 2 week f/u HTN, started initial med 2 wks ago-->lisinopril. Also has hx of mild IFG and hyperlipidemia--no meds required. She started having painful spots in mouth about 1 wk after taking the med, tongue felt hot, anything she ate felt spicy. No swelling of mouth but some redness noted inside.   Since stopping it 4-5 days ago she says her neck glands feel less swollen and mouth has improved some. Avg bp ON med 140s/80, avg bp in days since d/c of med 135/80.   (Twin sister is on metoprolol).  ROS: no CP, no SOB, no wheezing, no cough, no dizziness, no HAs, no rashes, no melena/hematochezia.  No polyuria or polydipsia.  No myalgias or arthralgias.   Past Medical History:  Diagnosis Date  . Arthritis   . Asthmatic bronchitis    "every winter"  . Gastroparesis    "stomach paresis" per pt's words.  W/u done but specifics unknown by pt.  She also says a re-eval was done and she was told she no longer has "stomach paresis"-->normal gastric emptying scan in EMR from 2007.  Dietary changes help.  Stress and OVEReating worsens it.  Marland Kitchen. GERD with esophagitis    2003 bx.  Gastric--no H pylori. SMall bowel and rectum bx: mild inflammatory cell infiltrate but no other abnormalities:   . History of adenomatous polyp of colon    Polyp is remote past (early 2000's?) per pt.  Most recent colonoscopy normal 05/2018--recall 10 yrs per pt (Dr. Kinnie ScalesMedoff).  . Hypercholesteremia   . Hypertension   . Hypothyroidism   . Microhematuria    Urol eval pending as of 06/2018  . Prediabetes   . Urine incontinence     Past Surgical History:  Procedure Laterality Date  . CESAREAN SECTION  2003 and 2004  . COLONOSCOPY  05/2018    Outpatient Medications Prior to Visit  Medication Sig Dispense Refill  . BLISOVI 24 FE 1-20 MG-MCG(24)  tablet Take 1 tablet by mouth daily.  0  . buPROPion (WELLBUTRIN XL) 300 MG 24 hr tablet Take 1 tablet by mouth daily.  4  . levothyroxine (SYNTHROID, LEVOTHROID) 88 MCG tablet Take 88 mcg by mouth daily.  4  . sertraline (ZOLOFT) 100 MG tablet Take 100 mg by mouth daily.  4   No facility-administered medications prior to visit.     Allergies  Allergen Reactions  . Barbiturates Other (See Comments)    Porphyria  . Codeine Nausea And Vomiting  . Lisinopril Other (See Comments)    Hypersalivation, but feeling of dry mouth/abnormal taste, some mouth sores, some feeling of slightly swollen neck glands. No angioedema/anaphylaxis sx's.    ROS As per HPI  PE: Blood pressure 113/70, pulse 82, temperature 98.5 F (36.9 C), temperature source Oral, resp. rate 16, height 5' 1.75" (1.568 m), weight 146 lb (66.2 kg), SpO2 99 %. Body mass index is 26.92 kg/m.  Gen: Alert, well appearing.  Patient is oriented to person, place, time, and situation. AFFECT: pleasant, lucid thought and speech. GNF:AOZHENT:Eyes: no injection, icteris, swelling, or exudate.  EOMI, PERRLA. Mouth: lips without lesion/swelling.  Oral mucosa pink and moist. Oropharynx without erythema, exudate, or swelling.  NECK: no LAD, TM, or tenderness. CV: RRR, no m/r/g.   LUNGS: CTA bilat, nonlabored  resps, good aeration in all lung fields. EXT: no clubbing, cyanosis, or edema.    LABS:  None today  IMPRESSION AND PLAN:  1) HTN; bp fine off meds at this time. Allergy to lisinopril noted/documented in chart. Hx of IFG, borderline HLD, and hypothyroidism: check fasting CBC, CMET, TSH, FLP, and HbA1c.  HIV screen today as well.  An After Visit Summary was printed and given to the patient.  FOLLOW UP: Return in about 3 months (around 10/21/2018) for f/u HTN.  Signed:  Santiago BumpersPhil Joell Buerger, MD           07/21/2018

## 2018-07-22 LAB — HIV ANTIBODY (ROUTINE TESTING W REFLEX): HIV 1&2 Ab, 4th Generation: NONREACTIVE

## 2018-08-05 ENCOUNTER — Encounter: Payer: Self-pay | Admitting: Family Medicine

## 2018-08-31 DIAGNOSIS — F4323 Adjustment disorder with mixed anxiety and depressed mood: Secondary | ICD-10-CM | POA: Diagnosis not present

## 2018-09-21 DIAGNOSIS — Z1231 Encounter for screening mammogram for malignant neoplasm of breast: Secondary | ICD-10-CM | POA: Diagnosis not present

## 2018-09-28 DIAGNOSIS — F4323 Adjustment disorder with mixed anxiety and depressed mood: Secondary | ICD-10-CM | POA: Diagnosis not present

## 2018-10-08 ENCOUNTER — Emergency Department (HOSPITAL_COMMUNITY): Payer: BLUE CROSS/BLUE SHIELD

## 2018-10-08 ENCOUNTER — Other Ambulatory Visit: Payer: Self-pay

## 2018-10-08 ENCOUNTER — Encounter (HOSPITAL_COMMUNITY): Payer: Self-pay

## 2018-10-08 ENCOUNTER — Ambulatory Visit: Payer: Self-pay | Admitting: *Deleted

## 2018-10-08 ENCOUNTER — Emergency Department (HOSPITAL_COMMUNITY)
Admission: EM | Admit: 2018-10-08 | Discharge: 2018-10-08 | Disposition: A | Discharge status: Home / Self Care | Payer: BLUE CROSS/BLUE SHIELD | Attending: Emergency Medicine | Admitting: Emergency Medicine

## 2018-10-08 DIAGNOSIS — E039 Hypothyroidism, unspecified: Secondary | ICD-10-CM | POA: Diagnosis not present

## 2018-10-08 DIAGNOSIS — Z79899 Other long term (current) drug therapy: Secondary | ICD-10-CM | POA: Diagnosis not present

## 2018-10-08 DIAGNOSIS — R5383 Other fatigue: Secondary | ICD-10-CM | POA: Diagnosis not present

## 2018-10-08 DIAGNOSIS — R0789 Other chest pain: Secondary | ICD-10-CM | POA: Diagnosis not present

## 2018-10-08 DIAGNOSIS — R42 Dizziness and giddiness: Secondary | ICD-10-CM | POA: Diagnosis not present

## 2018-10-08 DIAGNOSIS — R0602 Shortness of breath: Secondary | ICD-10-CM | POA: Insufficient documentation

## 2018-10-08 DIAGNOSIS — R070 Pain in throat: Secondary | ICD-10-CM | POA: Diagnosis not present

## 2018-10-08 DIAGNOSIS — I10 Essential (primary) hypertension: Secondary | ICD-10-CM | POA: Diagnosis not present

## 2018-10-08 LAB — CBC
HCT: 42.4 % (ref 36.0–46.0)
HEMOGLOBIN: 13.6 g/dL (ref 12.0–15.0)
MCH: 30 pg (ref 26.0–34.0)
MCHC: 32.1 g/dL (ref 30.0–36.0)
MCV: 93.4 fL (ref 80.0–100.0)
Platelets: 244 10*3/uL (ref 150–400)
RBC: 4.54 MIL/uL (ref 3.87–5.11)
RDW: 11.8 % (ref 11.5–15.5)
WBC: 6.4 10*3/uL (ref 4.0–10.5)
nRBC: 0 % (ref 0.0–0.2)

## 2018-10-08 LAB — BASIC METABOLIC PANEL
ANION GAP: 8 (ref 5–15)
BUN: 15 mg/dL (ref 6–20)
CO2: 24 mmol/L (ref 22–32)
Calcium: 9.3 mg/dL (ref 8.9–10.3)
Chloride: 107 mmol/L (ref 98–111)
Creatinine, Ser: 0.7 mg/dL (ref 0.44–1.00)
GFR calc Af Amer: >60 mL/min (ref >60)
Glucose, Bld: 98 mg/dL (ref 70–99)
POTASSIUM: 3.7 mmol/L (ref 3.5–5.1)
SODIUM: 139 mmol/L (ref 135–145)

## 2018-10-08 LAB — I-STAT TROPONIN, ED: Troponin i, poc: 0 ng/mL (ref 0.00–0.08)

## 2018-10-08 LAB — I-STAT BETA HCG BLOOD, ED (MC, WL, AP ONLY)

## 2018-10-08 MED ORDER — HYDROCHLOROTHIAZIDE 12.5 MG PO TABS: 12.5000 mg | ORAL_TABLET | Freq: Every day | ORAL | 0 refills | Status: DC

## 2018-10-08 MED ORDER — HYDROCHLOROTHIAZIDE 12.5 MG PO CAPS
12.5000 mg | ORAL_CAPSULE | Freq: Once | ORAL | Status: AC
  Administered 2018-10-08: 12.5 mg via ORAL
  Filled 2018-10-08: qty 1

## 2018-10-08 NOTE — Telephone Encounter (Signed)
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 [1] Systolic BP  >= 160 OR Diastolic >= 100 AND [2] cardiac or neurologic symptoms (e.g., chest pain, difficulty breathing, unsteady gait, blurred vision)  Answer Assessment - Initial Assessment Questions 1. BLOOD PRESSURE: "What is the blood pressure?" "Did you take at least two measurements 5 minutes apart?"     Yes 163/92 and 191/95 2. ONSET: "When did you take your blood pressure?"     10/08/18 at 0830 and 1009 3. HOW: "How did you obtain the blood pressure?" (e.g., visiting nurse, automatic home BP monitor)     Automatic cuff right arm at work 4. HISTORY: "Do you have a history of high blood pressure?"     Yes; previously took lisinopril but d/c'd due to allergy 5. MEDICATIONS: "Are you taking any medications for blood pressure?" "Have you missed any doses recently?"   no 6. OTHER SYMPTOMS: "Do you have any symptoms?" (e.g., headache, chest pain, blurred vision, difficulty breathing, weakness)     Nausea, fatigue, chest feels "hot internally but not heartburn", unsteady gait when standing  7. PREGNANCY: "Is there any chance you are pregnant?" "When was your last menstrual period?"     no  Protocols used: HIGH BLOOD PRESSURE-A-AH

## 2018-10-08 NOTE — ED Triage Notes (Signed)
Pt presents for evaluation of generalized weakness/fatigue and hypertension. States started this morning around 0830, states BP was 191/95 this morning.

## 2018-10-08 NOTE — ED Notes (Signed)
Patient verbalizes understanding of discharge instructions. Opportunity for questioning and answers were provided. Ambulatory at discharge in NAD.  

## 2018-10-08 NOTE — Discharge Instructions (Signed)
Please read and follow all provided instructions.  Your diagnoses today include:  1. Hypertension, unspecified type   2. Dizziness     Tests performed today include: An EKG of your heart A chest x-ray Cardiac enzymes - a blood test for heart muscle damage Blood counts and electrolytes Vital signs. See below for your results today.   Medications prescribed:   Take any prescribed medications only as directed.  Please take hydrochlorothiazide 12.5 mg daily in the morning.  Please have a ride to work tomorrow to you how this will affect you.  Follow-up instructions: Please follow-up with your primary care provider as soon as you can for further evaluation of your symptoms.   Follow-up within the next 7 day period with your primary care provider for recheck.  Return instructions:  SEEK IMMEDIATE MEDICAL ATTENTION IF: You have severe chest pain, especially if the pain is crushing or pressure-like and spreads to the arms, back, neck, or jaw, or if you have sweating, nausea (feeling sick to your stomach), or shortness of breath. THIS IS AN EMERGENCY. Don't wait to see if the pain will go away. Get medical help at once. Call 911 or 0 (operator). DO NOT drive yourself to the hospital.  Your chest pain gets worse and does not go away with rest.  You have an attack of chest pain lasting longer than usual, despite rest and treatment with the medications your caregiver has prescribed.  You wake from sleep with chest pain or shortness of breath. You feel dizzy or faint. You have chest pain not typical of your usual pain for which you originally saw your caregiver.  You have any other emergent concerns regarding your health.  Additional Information:  Your vital signs today were: BP (!) 149/87    Pulse 84    Temp 98.6 F (37 C) (Oral)    Resp 13    Ht 5\' 2"  (1.575 m)    Wt 65.8 kg    SpO2 98%    BMI 26.52 kg/m  If your blood pressure (BP) was elevated above 135/85 this visit, please have this  repeated by your doctor within one month. --------------

## 2018-10-08 NOTE — ED Notes (Signed)
Pt ambulatory to restroom

## 2018-10-08 NOTE — Telephone Encounter (Signed)
Noted  

## 2018-10-08 NOTE — ED Notes (Signed)
Pt given turkey sandwich and drink 

## 2018-10-09 NOTE — ED Provider Notes (Signed)
[facility] EMERGENCY DEPARTMENT Provider Note   CSN: [id number] Arrival date & time: 10/08/18  1138     History   Chief Complaint Chief Complaint  Patient presents with  . Weakness  . Hypertension    HPI Heather Buchanan is a 52 y.o. female.  HPI  Patient is a 52 year old female with a history of hypertension, hypothyroidism, depression presenting for episode of lightheadedness, feeling fatigued, short of breath and a sensation of burning in the upper chest and throat.  Patient reports that this occurred when she went to work on 8:30 AM.  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  Patient reports she is continued to have symptoms about 1.5 hours later and notes her blood pressure was 191 systolic at work.  Patient reports that her symptoms have slowly resolved and are not present currently, but she feels generally fatigued.  Patient denies any chest pain, shortness of breath, syncope or presyncope, weakness, numbness, visual disturbance, or problems with gait.  Patient denies any history of cardiovascular disease.  Patient is a never smoker.  Patient was on antihypertensives until 3 months ago when she had a reaction to lisinopril.  Since then, she has not been on any antihypertensives.  Patient is scheduled to follow-up with her primary care provider this month.   Past Medical History:  Diagnosis Date  . Arthritis   . Asthmatic bronchitis    "every winter"  . Borderline hypercholesterolemia 07/2018   10 yr Framingham CV risk= 1.8%.  . Gastroparesis    "stomach paresis" per pt's words.  W/u done but specifics unknown by pt.  She also says a re-eval was done and she was told she no longer has "stomach paresis"-->normal gastric emptying scan in EMR from 2007.  Dietary changes help.  Stress and OVEReating worsens it.  Marland Kitchen GERD with esophagitis    2003 bx.  Gastric--no  H pylori. SMall bowel and rectum bx: mild inflammatory cell infiltrate but no other abnormalities:   . History of adenomatous polyp of colon    Polyp is remote past (early 2000's?) per pt.  Most recent colonoscopy normal 05/2018--recall 10 yrs per pt (Dr. Kinnie Scales).  . Hypertension    Lisin 06/2018-->allergy.  BP normal w/out meds after (as of 07/21/18).  . Hypothyroidism   . IFG (impaired fasting glucose) 07/2018   Gluc 107, HbA1c 5.5%.  . Microhematuria 06/2018   Urol w/u (CT and cystoscopy) NEG/NL (Dr. Alvester Morin).  . Urine incontinence     There are no active problems to display for this patient.   Past Surgical History:  Procedure Laterality Date  . CESAREAN SECTION  2003 and 2004  . COLONOSCOPY  05/2018     OB History    Gravida  4   Para  4   Term      Preterm      AB      Living  4     SAB      TAB      Ectopic      Multiple      Live Births  4            Home Medications    Prior to Admission medications   Medication Sig Start Date End Date Taking? Authorizing Provider  BLISOVI 24 FE 1-20 MG-MCG(24) tablet Take 1 tablet by mouth every evening.  05/13/18  Yes  [provider]  buPROPion (WELLBUTRIN XL) 300 MG 24 hr tablet Take 1 tablet by mouth daily. 05/12/18  Yes [provider]  Ibuprofen-diphenhydrAMINE HCl (ADVIL PM) 200-25 MG CAPS Take 2 capsules by mouth at bedtime.   Yes [provider]  levothyroxine (SYNTHROID, LEVOTHROID) 88 MCG tablet Take 88 mcg by mouth daily. 04/15/18  Yes [provider]  sertraline (ZOLOFT) 100 MG tablet Take 100 mg by mouth every evening.  05/05/18  Yes [provider]  hydrochlorothiazide (HYDRODIURIL) 12.5 MG tablet Take 1 tablet (12.5 mg total) by mouth daily for 10 days. 10/08/18 10/18/18  Elisha Ponder, PA-C    Family History Family History  Problem Relation Age of Onset  . Arthritis Mother   . Hyperlipidemia Mother   . Hypertension Mother   . Hearing loss Father   .  Depression Sister   . Bipolar disorder Sister     Social History Social History   Tobacco Use  . Smoking status: Never Smoker  . Smokeless tobacco: Never Used  Substance Use Topics  . Alcohol use: Yes  . Drug use: Never     Allergies   Barbiturates; Codeine; and Lisinopril   Review of Systems Review of Systems  Constitutional: Positive for fatigue. Negative for chills and fever.  HENT: Negative for congestion, rhinorrhea, sinus pain and sore throat.   Eyes: Negative for visual disturbance.  Respiratory: Negative for cough, chest tightness and shortness of breath.   Cardiovascular: Negative for chest pain, palpitations and leg swelling.  Gastrointestinal: Negative for abdominal pain, nausea and vomiting.  Genitourinary: Negative for dysuria and flank pain.  Musculoskeletal: Negative for back pain and myalgias.  Skin: Negative for rash.  Neurological: Positive for light-headedness. Negative for dizziness, syncope and headaches.     Physical Exam Updated Vital Signs BP 138/87   Pulse 74   Temp 98.6 F (37 C) (Oral)   Resp 13   Ht 5\' 2"  (1.575 m)   Wt 65.8 kg   SpO2 99%   BMI 26.52 kg/m   Physical Exam  Constitutional: She appears well-developed and well-nourished. No distress.  HENT:  Head: Normocephalic and atraumatic.  Mouth/Throat: Oropharynx is clear and moist.  Eyes: Pupils are equal, round, and reactive to light. Conjunctivae and EOM are normal.  Neck: Normal range of motion. Neck supple.  Cardiovascular: Normal rate, regular rhythm, S1 normal and S2 normal.  Murmur heard. +SEM  Pulmonary/Chest: Effort normal and breath sounds normal. She has no wheezes. She has no rales.  Abdominal: Soft. She exhibits no distension. There is no tenderness. There is no guarding.  Musculoskeletal: Normal range of motion. She exhibits no edema or deformity.  Neurological: She is alert.  Mental Status:  Alert, oriented, thought content appropriate, able to give a  coherent history. Speech fluent without evidence of aphasia. Able to follow 2 step commands without difficulty.  Cranial Nerves:  II:  Peripheral visual fields grossly normal, pupils equal, round, reactive to light III,IV, VI: ptosis not present, extra-ocular motions intact bilaterally  V,VII: smile symmetric, facial light touch sensation equal VIII: hearing grossly normal to voice  X: uvula elevates symmetrically  XI: bilateral shoulder shrug symmetric and strong XII: midline tongue extension without fassiculations Motor:  Normal tone. 5/5 in upper and lower extremities bilaterally including strong and equal grip strength and dorsiflexion/plantar flexion Normal and symmetric gait. Stance: No pronator drift and good coordination, strength, and position sense with tapping of bilateral arms (performed in sitting position).  Skin: Skin is warm and dry. No rash noted. No erythema.  Psychiatric: She has a normal mood and affect. Her behavior is normal. Judgment and thought content normal.  Nursing note and vitals reviewed.    ED Treatments / Results  Labs (all labs ordered are listed, but only abnormal results are displayed) Labs Reviewed  BASIC METABOLIC PANEL  CBC  I-STAT BETA HCG BLOOD, ED ([facility], [facility], [facility] ONLY)  I-STAT TROPONIN, ED    EKG EKG Interpretation  Date/Time:  Thursday October 08 2018 11:51:23 EDT Ventricular Rate:  80 PR Interval:  142 QRS Duration: 90 QT Interval:  398 QTC Calculation: 459 R Axis:   41 Text Interpretation:  Normal sinus rhythm Normal ECG Confirmed by Margarita Grizzle [phone]) on 10/08/2018 2:37:23 PM   Radiology Dg Chest 2 View  Result Date: 10/08/2018 CLINICAL DATA:  Chest tightness EXAM: CHEST - 2 VIEW COMPARISON:  None. FINDINGS: Lungs are clear. Heart size and pulmonary vascularity are normal. No adenopathy. No pneumothorax. No bone lesions. IMPRESSION: No edema or consolidation. Electronically Signed   By: Bretta Bang III M.D.   On:  10/08/2018 12:19    Procedures Procedures (including critical care time)  Medications Ordered in ED Medications  hydrochlorothiazide (MICROZIDE) capsule 12.5 mg (12.5 mg Oral Given 10/08/18 1502)     Initial Impression / Assessment and Plan / ED Course  I have reviewed the triage vital signs and the nursing notes.  Pertinent labs & imaging results that were available during my care of the patient were reviewed by me and considered in my medical decision making (see chart for details).     Patient is nontoxic-appearing, neurologically intact, hemodynamically stable.  Patient has had multiple hypertensive readings in the emergency department, but not elevated to level suspected with hypertensive urgency or emergency.  Patient is asymptomatic at present and back to her baseline.  Do not suspect active endorgan damage from hypertension.  Lab work is normal.  No elevation in troponin.  EKG without evidence of ischemia, infarction or arrhythmia.  No evidence of widened mediastinum or cardiac pulmonary abnormality on chest x-ray.  Patient ambulatory, tolerating p.o., and in no acute distress at time of discharge.  We did discuss reinitiating antihypertensives, and elected to initiate hydrochlorothiazide.  Patient instructed to have a rightward monitor she knows how this medication will affect her.  Patient to follow-up with her primary care provider in the next 7-10 days.   Return precautions were given for any chest pain, shortness of breath, lightheadedness, dizziness, syncope or presyncope.  Patient is in understanding and agrees with plan of care.  This is a supervised visit with Dr. Margarita Grizzle. Evaluation, management, and discharge planning discussed with this attending physician.  Final Clinical Impressions(s) / ED Diagnoses   Final diagnoses:  Hypertension, unspecified type  Dizziness    ED Discharge Orders         Ordered    hydrochlorothiazide (HYDRODIURIL) 12.5 MG tablet   Daily     10/08/18 1446           Elisha Ponder, PA-C 10/09/18 0048    Margarita Grizzle, MD 10/09/18 [phone]

## 2018-10-19 ENCOUNTER — Ambulatory Visit: Payer: BLUE CROSS/BLUE SHIELD | Admitting: Family Medicine

## 2018-10-19 ENCOUNTER — Encounter: Payer: Self-pay | Admitting: Family Medicine

## 2018-10-19 VITALS — BP 151/51 | HR 84 | Temp 98.2°F | Resp 16 | Ht 61.75 in | Wt 150.0 lb

## 2018-10-19 DIAGNOSIS — F430 Acute stress reaction: Secondary | ICD-10-CM | POA: Diagnosis not present

## 2018-10-19 DIAGNOSIS — I10 Essential (primary) hypertension: Secondary | ICD-10-CM

## 2018-10-19 DIAGNOSIS — F411 Generalized anxiety disorder: Secondary | ICD-10-CM

## 2018-10-19 MED ORDER — LORAZEPAM 0.5 MG PO TABS: ORAL_TABLET | ORAL | 0 refills | Status: DC

## 2018-10-19 MED ORDER — HYDROCHLOROTHIAZIDE 25 MG PO TABS: 25.0000 mg | ORAL_TABLET | Freq: Every day | ORAL | 0 refills | Status: DC

## 2018-10-19 NOTE — Progress Notes (Signed)
OFFICE VISIT  10/19/2018   CC:  Chief Complaint  Patient presents with  . Follow-up    ER - HTN    HPI:    Patient is a 52 y.o. Caucasian female who presents for f/u from ED visit on 10/08/18. Presented to ED that day after having fatigue, dizziness, SOB, sensation of burning in upper chest and throat. BP elevated 160-190 systolic range. Reviewed ED records today. Lab work is normal.  No elevation in troponin.  EKG without evidence of ischemia, infarction or arrhythmia.  No evidence of widened mediastinum or cardiac pulmonary abnormality on chest x-ray. She was d/c'd home in improved condition with instructions to start hctz 12.5mg  qd.  Interim Hx: mildly improved but still having same problems-->still intermittently with acute fatigue, nausea, swimmy headed, slight mental fog, chest feels hot "internally".    Checks bp 3 times per day.  Says she correlates her higher bp's with these sx's. 150/80s consistently, even since getting on hctz (which she ran out of yesterday)..  She has documented bp's closer to normal in the 1-2 months prior to the day she went to the ED when she was off bp med during that time. She is currently going through a divorce.  Commonly feels overwhelmed. She is very sensitive to her bp elevations as well as "when I don't eat my sugar runs low".    She brings up that her worry about HTN is intensified due to her sister and mom having HTN.  Snores but no witnessed apnea.   She has midnight awakenings that last a couple of hours.   Takes this med every other night. Says she is not depressed but is hyper-emotional---cries easily when she gets a trigger. Has more good days than bad days.  Appetite is little down, particularly when she has sx's that she associates with HTN.    Past Medical History:  Diagnosis Date  . Arthritis   . Asthmatic bronchitis    "every winter"  . Borderline hypercholesterolemia 07/2018   10 yr Framingham CV risk= 1.8%.  .  Gastroparesis    "stomach paresis" per pt's words.  W/u done but specifics unknown by pt.  She also says a re-eval was done and she was told she no longer has "stomach paresis"-->normal gastric emptying scan in EMR from 2007.  Dietary changes help.  Stress and OVEReating worsens it.  Marland Kitchen GERD with esophagitis    2003 bx.  Gastric--no H pylori. SMall bowel and rectum bx: mild inflammatory cell infiltrate but no other abnormalities:   . History of adenomatous polyp of colon    Polyp is remote past (early 2000's?) per pt.  Most recent colonoscopy normal 05/2018--recall 10 yrs per pt (Dr. Kinnie Scales).  . Hypertension    Lisin 06/2018-->allergy.  BP normal w/out meds after (as of 07/21/18).  . Hypothyroidism   . IFG (impaired fasting glucose) 07/2018   Gluc 107, HbA1c 5.5%.  . Microhematuria 06/2018   Urol w/u (CT and cystoscopy) NEG/NL (Dr. Alvester Morin).  . Urine incontinence     Past Surgical History:  Procedure Laterality Date  . CESAREAN SECTION  2003 and 2004  . COLONOSCOPY  05/2018    Outpatient Medications Prior to Visit  Medication Sig Dispense Refill  . BLISOVI 24 FE 1-20 MG-MCG(24) tablet Take 1 tablet by mouth every evening.   0  . buPROPion (WELLBUTRIN XL) 300 MG 24 hr tablet Take 1 tablet by mouth daily.  4  . Ibuprofen-diphenhydrAMINE HCl (ADVIL PM) 200-25 MG CAPS  Take 2 capsules by mouth at bedtime.    Marland Kitchen levothyroxine (SYNTHROID, LEVOTHROID) 88 MCG tablet Take 88 mcg by mouth daily.  4  . sertraline (ZOLOFT) 100 MG tablet Take 100 mg by mouth every evening.   4  . hydrochlorothiazide (HYDRODIURIL) 12.5 MG tablet Take 1 tablet (12.5 mg total) by mouth daily for 10 days. 10 tablet 0   No facility-administered medications prior to visit.     Allergies  Allergen Reactions  . Barbiturates Other (See Comments)    Porphyria  . Codeine Nausea And Vomiting  . Lisinopril Other (See Comments)    Hypersalivation, but feeling of dry mouth/abnormal taste, some mouth sores, some feeling of  slightly swollen neck glands. No angioedema/anaphylaxis sx's.    ROS As per HPI  PE: Blood pressure (!) 151/51, pulse 84, temperature 98.2 F (36.8 C), temperature source Oral, resp. rate 16, height 5' 1.75" (1.568 m), weight 150 lb (68 kg), SpO2 97 %. Gen: Alert, well appearing.  Patient is oriented to person, place, time, and situation. AFFECT: pleasant, lucid thought and speech. Neck - No masses or thyromegaly or limitation in range of motion CV: RRR, no m/r/g (maybe a trace of systolic murmur in aortic valve area, but I really struggled to hear it).   LUNGS: CTA bilat, nonlabored resps, good aeration in all lung fields. ABD: soft, NT, ND, BS normal.  No hepatospenomegaly or mass.  No bruits. EXT: no clubbing or cyanosis.  no edema.    LABS:    Chemistry      Component Value Date/Time   NA 139 10/08/2018 1207   K 3.7 10/08/2018 1207   CL 107 10/08/2018 1207   CO2 24 10/08/2018 1207   BUN 15 10/08/2018 1207   CREATININE 0.70 10/08/2018 1207   GLU 102 04/15/2018      Component Value Date/Time   CALCIUM 9.3 10/08/2018 1207   ALKPHOS 67 07/21/2018 0842   AST 11 07/21/2018 0842   ALT 10 07/21/2018 0842   BILITOT 0.5 07/21/2018 0842     Lab Results  Component Value Date   WBC 6.4 10/08/2018   HGB 13.6 10/08/2018   HCT 42.4 10/08/2018   MCV 93.4 10/08/2018   PLT 244 10/08/2018   Lab Results  Component Value Date   TSH 1.87 07/21/2018   IMPRESSION AND PLAN:  1) Uncontrolled HTN: she is either significantly symptomatic with only mildly elevated bp OR her worry/stress going on in her personal life  PLUS her worry about her physical state/bp is leading to physical sx's. Plan: restart hctz at 25mg  qd dosing.  BMET today (her last dose of hctz was yesterday).  2) GAD, with adjustment disorder/acute stress rxn -->divorce. She is not getting good sleep and this is making everything worse. We discussed use of lorazepam on prn basis and decided to go with 0.5mg  lorazepam  bid prn.  I rx'd #30 today with no RF.  Therapeutic expectations and side effect profile of medication discussed today.  Patient's questions answered. A future option, which I mentioned to her today, is maximizing her sertraline dosing to 200mg  qd. She is significantly apprehensive about taking medications-->it is a psychological hurdle that she may have to get over.  Spent 30 min with pt today, with >50% of this time spent in counseling and care coordination regarding the above problems.  An After Visit Summary was printed and given to the patient.  FOLLOW UP: Return for 10-14d f/u HTN/GAD.   Signed:  Santiago Bumpers, MD  10/19/2018     

## 2018-10-20 LAB — BASIC METABOLIC PANEL
BUN: 25 mg/dL — ABNORMAL HIGH (ref 6–23)
CO2: 27 meq/L (ref 19–32)
Calcium: 9.6 mg/dL (ref 8.4–10.5)
Chloride: 104 mEq/L (ref 96–112)
Creatinine, Ser: 0.8 mg/dL (ref 0.40–1.20)
GFR: 80.41 mL/min (ref >60.00)
GLUCOSE: 100 mg/dL — AB (ref 70–99)
POTASSIUM: 3.8 meq/L (ref 3.5–5.1)
SODIUM: 139 meq/L (ref 135–145)

## 2018-10-26 DIAGNOSIS — F4323 Adjustment disorder with mixed anxiety and depressed mood: Secondary | ICD-10-CM | POA: Diagnosis not present

## 2018-11-02 ENCOUNTER — Ambulatory Visit: Payer: BLUE CROSS/BLUE SHIELD | Admitting: Family Medicine

## 2018-11-02 ENCOUNTER — Encounter: Payer: Self-pay | Admitting: Family Medicine

## 2018-11-02 NOTE — Progress Notes (Deleted)
OFFICE VISIT  11/02/2018   CC: No chief complaint on file.   HPI:    Patient is a 52 y.o. Caucasian female who presents for 2 week f/u HTN and GAD/acute stress rxn. Increased her hctz to 25mg  qd last visit.  +Symptomatic with only mild/mod elevations of bp.  However, with her stresses in life now, it could be that her elevated bp and worry generated by this is playing more of a role in generating her physical sx's than actual bp causing the symptoms. BMET last visit wnl.  GAD with acute stress rxn:  Last visit I added 0.5mg  lorazepam bid (#30, no RF) prn to her regimen of sertraline 100 mg qd and wellbutrin xl 300 qd.  Past Medical History:  Diagnosis Date  . Arthritis   . Asthmatic bronchitis    "every winter"  . Borderline hypercholesterolemia 07/2018   10 yr Framingham CV risk= 1.8%.  . Gastroparesis    "stomach paresis" per pt's words.  W/u done but specifics unknown by pt.  She also says a re-eval was done and she was told she no longer has "stomach paresis"-->normal gastric emptying scan in EMR from 2007.  Dietary changes help.  Stress and OVEReating worsens it.  Marland Kitchen GERD with esophagitis    2003 bx.  Gastric--no H pylori. SMall bowel and rectum bx: mild inflammatory cell infiltrate but no other abnormalities:   . History of adenomatous polyp of colon    Polyp is remote past (early 2000's?) per pt.  Most recent colonoscopy normal 05/2018--recall 10 yrs per pt (Dr. Kinnie Scales).  . Hypertension    Lisin 06/2018-->allergy.  BP normal w/out meds after (as of 07/21/18).  . Hypothyroidism   . IFG (impaired fasting glucose) 07/2018   Gluc 107, HbA1c 5.5%.  . Microhematuria 06/2018   Urol w/u (CT and cystoscopy) NEG/NL (Dr. Alvester Morin).  . Urine incontinence     Past Surgical History:  Procedure Laterality Date  . CESAREAN SECTION  2003 and 2004  . COLONOSCOPY  05/2018    Outpatient Medications Prior to Visit  Medication Sig Dispense Refill  . BLISOVI 24 FE 1-20 MG-MCG(24) tablet Take 1  tablet by mouth every evening.   0  . buPROPion (WELLBUTRIN XL) 300 MG 24 hr tablet Take 1 tablet by mouth daily.  4  . hydrochlorothiazide (HYDRODIURIL) 25 MG tablet Take 1 tablet (25 mg total) by mouth daily. 30 tablet 0  . Ibuprofen-diphenhydrAMINE HCl (ADVIL PM) 200-25 MG CAPS Take 2 capsules by mouth at bedtime.    Marland Kitchen levothyroxine (SYNTHROID, LEVOTHROID) 88 MCG tablet Take 88 mcg by mouth daily.  4  . LORazepam (ATIVAN) 0.5 MG tablet 1 tab po bid prn anxiety and/or insomnia 30 tablet 0  . sertraline (ZOLOFT) 100 MG tablet Take 100 mg by mouth every evening.   4   No facility-administered medications prior to visit.     Allergies  Allergen Reactions  . Barbiturates Other (See Comments)    Porphyria  . Codeine Nausea And Vomiting  . Lisinopril Other (See Comments)    Hypersalivation, but feeling of dry mouth/abnormal taste, some mouth sores, some feeling of slightly swollen neck glands. No angioedema/anaphylaxis sx's.    ROS As per HPI  PE: There were no vitals taken for this visit. ***  LABS:    Chemistry      Component Value Date/Time   NA 139 10/19/2018 1635   K 3.8 10/19/2018 1635   CL 104 10/19/2018 1635   CO2 27 10/19/2018  1635   BUN 25 (H) 10/19/2018 1635   CREATININE 0.80 10/19/2018 1635   GLU 102 04/15/2018      Component Value Date/Time   CALCIUM 9.6 10/19/2018 1635   ALKPHOS 67 07/21/2018 0842   AST 11 07/21/2018 0842   ALT 10 07/21/2018 0842   BILITOT 0.5 07/21/2018 0842       IMPRESSION AND PLAN:  No problem-specific Assessment & Plan notes found for this encounter.   An After Visit Summary was printed and given to the patient.  FOLLOW UP: No follow-ups on file.

## 2018-11-10 ENCOUNTER — Other Ambulatory Visit: Payer: Self-pay | Admitting: Family Medicine

## 2018-11-10 NOTE — Telephone Encounter (Signed)
Left message for pt to call back to schedule a f/u for HTN and Anxiety.  Will send Rx for hctz #30 w/ 0RF.   Needs office visit for more refills.

## 2018-11-13 NOTE — Telephone Encounter (Signed)
Left message for pt to call back  °

## 2018-11-17 ENCOUNTER — Encounter: Payer: Self-pay | Admitting: *Deleted

## 2018-11-17 NOTE — Telephone Encounter (Signed)
Mailed letter to address in EMR. 

## 2018-11-18 DIAGNOSIS — F4323 Adjustment disorder with mixed anxiety and depressed mood: Secondary | ICD-10-CM | POA: Diagnosis not present

## 2018-12-12 ENCOUNTER — Other Ambulatory Visit: Payer: Self-pay | Admitting: Family Medicine

## 2018-12-17 DIAGNOSIS — F4323 Adjustment disorder with mixed anxiety and depressed mood: Secondary | ICD-10-CM | POA: Diagnosis not present

## 2018-12-21 ENCOUNTER — Other Ambulatory Visit: Payer: Self-pay | Admitting: Family Medicine

## 2018-12-21 NOTE — Telephone Encounter (Signed)
RX for HCTZ 25 mg sent in for 30 day supply, only because I contacted patient who is over due for an OV and asked her to schedule appointment.  Patient is aware that this will be our last 30 day supply until she is seen in office.

## 2019-01-04 ENCOUNTER — Encounter: Payer: Self-pay | Admitting: Family Medicine

## 2019-01-04 ENCOUNTER — Ambulatory Visit: Payer: BLUE CROSS/BLUE SHIELD | Admitting: Family Medicine

## 2019-01-04 ENCOUNTER — Encounter (INDEPENDENT_AMBULATORY_CARE_PROVIDER_SITE_OTHER): Payer: Self-pay

## 2019-01-04 ENCOUNTER — Encounter: Payer: Self-pay | Admitting: *Deleted

## 2019-01-04 VITALS — BP 134/73 | HR 81 | Temp 98.0°F | Resp 16 | Ht 61.75 in | Wt 148.5 lb

## 2019-01-04 DIAGNOSIS — I10 Essential (primary) hypertension: Secondary | ICD-10-CM | POA: Diagnosis not present

## 2019-01-04 DIAGNOSIS — G43909 Migraine, unspecified, not intractable, without status migrainosus: Secondary | ICD-10-CM | POA: Diagnosis not present

## 2019-01-04 DIAGNOSIS — F419 Anxiety disorder, unspecified: Secondary | ICD-10-CM

## 2019-01-04 MED ORDER — LORAZEPAM 0.5 MG PO TABS: ORAL_TABLET | ORAL | 2 refills | Status: AC

## 2019-01-04 MED ORDER — RIZATRIPTAN BENZOATE 10 MG PO TABS: 10.0000 mg | ORAL_TABLET | ORAL | 1 refills | Status: AC | PRN

## 2019-01-04 MED ORDER — HYDROCHLOROTHIAZIDE 25 MG PO TABS: 25.0000 mg | ORAL_TABLET | Freq: Every day | ORAL | 1 refills | Status: DC

## 2019-01-04 NOTE — Progress Notes (Signed)
OFFICE VISIT  01/04/2019   CC:  Chief Complaint  Patient presents with  . Follow-up    HTN. Blood pressure has been running high for two days.    HPI:    Patient is a 53 y.o. Caucasian female who presents for 2 and 1/2 month f/u uncontrolled HTN. At last visit with her she was having some acute elevations in her bp that caused significant anxiety symptoms. She was going through a divorce at that time and I dx'd her with acute stress rxn/adjustment d/o that was complicating her bp mgmt. I increased her hctz to 25mg  qd at that time.  BMET normal that day. I also rx'd lorazepam 0.5mg  to use prn severe stress/anxiety, which hopefully would also help keep her bp from spiking from the stress/anxiety. She did not follow up as instructed after that visit.  Interim Hx: Pt describes bp as 130/70 avg at home with upper arm cuff, except for when she gets a bad headache. She has been checking her bp after she gets the HA and assuming that b/c it is elevated, her sx's must be DUE to the elevated bp.  I think, rather, that her migraine is leading to acute bp elevation.  She describes onset of HA in peri-orbital region on one side, then spreads to involve both sides and the forehead, severe and throbbing nature, followed shortly by nausea and fatigue.  No light or noise sensitivity.  She does not vomit.  She feels a little lightheaded but no vertigo or ataxia/falls.  No paresthesias or focal weakness.   She has about 2 of these HA's per month.  Tylenol no help.  Eventually has to lay down and go to sleep in order for HA to go away. Has never been dx'd with migraines.  +FH migraines.  Of note, she has used her lorazepam when very stressed or if anxious at bedtime and can't sleep for a couple hours after laying down.  I gave #30 over 2 months ago and she has a few left.  We discussed ongoing use of this med for acute severe anxiety and decided to keep her on it.  Past Medical History:  Diagnosis Date  .  Arthritis   . Asthmatic bronchitis    "every winter"  . Borderline hypercholesterolemia 07/2018   10 yr Framingham CV risk= 1.8%.  . Gastroparesis    "stomach paresis" per pt's words.  W/u done but specifics unknown by pt.  She also says a re-eval was done and she was told she no longer has "stomach paresis"-->normal gastric emptying scan in EMR from 2007.  Dietary changes help.  Stress and OVEReating worsens it.  Marland Kitchen GERD with esophagitis    2003 bx.  Gastric--no H pylori. SMall bowel and rectum bx: mild inflammatory cell infiltrate but no other abnormalities:   . History of adenomatous polyp of colon    Polyp is remote past (early 2000's?) per pt.  Most recent colonoscopy normal 05/2018--recall 10 yrs per pt (Dr. Kinnie Scales).  . Hypertension    Lisin 06/2018-->allergy.  BP normal w/out meds after (as of 07/21/18).  . Hypothyroidism   . IFG (impaired fasting glucose) 07/2018   Gluc 107, HbA1c 5.5%.  . Microhematuria 06/2018   Urol w/u (CT and cystoscopy) NEG/NL (Dr. Alvester Morin).  . Urine incontinence     Past Surgical History:  Procedure Laterality Date  . CESAREAN SECTION  2003 and 2004  . COLONOSCOPY  05/2018    Outpatient Medications Prior to Visit  Medication  Sig Dispense Refill  . BLISOVI 24 FE 1-20 MG-MCG(24) tablet Take 1 tablet by mouth every evening.   0  . buPROPion (WELLBUTRIN XL) 300 MG 24 hr tablet Take 1 tablet by mouth daily.  4  . Ibuprofen-diphenhydrAMINE HCl (ADVIL PM) 200-25 MG CAPS Take 2 capsules by mouth at bedtime.    Marland Kitchen. levothyroxine (SYNTHROID, LEVOTHROID) 88 MCG tablet Take 88 mcg by mouth daily.  4  . sertraline (ZOLOFT) 100 MG tablet Take 100 mg by mouth every evening.   4  . hydrochlorothiazide (HYDRODIURIL) 25 MG tablet TAKE 1 TABLET (25 MG TOTAL) BY MOUTH DAILY. OFFICE VISIT NEEDED FOR MORE REFILLS. 30 tablet 0  . LORazepam (ATIVAN) 0.5 MG tablet 1 tab po bid prn anxiety and/or insomnia 30 tablet 0   No facility-administered medications prior to visit.      Allergies  Allergen Reactions  . Barbiturates Other (See Comments)    Porphyria  . Codeine Nausea And Vomiting  . Lisinopril Other (See Comments)    Hypersalivation, but feeling of dry mouth/abnormal taste, some mouth sores, some feeling of slightly swollen neck glands. No angioedema/anaphylaxis sx's.    ROS As per HPI  PE: Blood pressure 134/73, pulse 81, temperature 98 F (36.7 C), temperature source Oral, resp. rate 16, height 5' 1.75" (1.568 m), weight 148 lb 8 oz (67.4 kg), SpO2 99 %. Gen: Alert, well appearing.  Patient is oriented to person, place, time, and situation. AFFECT: pleasant, lucid thought and speech. No further exam today.  LABS:    Chemistry      Component Value Date/Time   NA 139 10/19/2018 1635   K 3.8 10/19/2018 1635   CL 104 10/19/2018 1635   CO2 27 10/19/2018 1635   BUN 25 (H) 10/19/2018 1635   CREATININE 0.80 10/19/2018 1635   GLU 102 04/15/2018      Component Value Date/Time   CALCIUM 9.6 10/19/2018 1635   ALKPHOS 67 07/21/2018 0842   AST 11 07/21/2018 0842   ALT 10 07/21/2018 0842   BILITOT 0.5 07/21/2018 0842      IMPRESSION AND PLAN:  1) HTN: well controlled on hctz 25mg  qd. RF'd this today, #90, RF x 1.  2) Migraine HA syndrome: I think this is causing periodic rises in bp.  Will do trial of maxalt (with warning given to patient about potential for bp elevation from triptans, so she'll continue to monitor bp at home).  Maxalt 10mg , 1 qd prn severe HA, #10, RF x 1.  3) Anxiety syndrome: she is already on sertraline and wellbutrin. She finds prn use of lorazepam helpful for her severe anxiety and anxiety-related insomnia, and hopefully this is preventing some acute bp elevations as well. Controlled substance contract reviewed with patient today.  Patient signed this and it will be placed in the chart.   Lorazepam 0.5mg , 1 bid prn, #30, RF x 2.  An After Visit Summary was printed and given to the patient.  FOLLOW UP: Return in  about 6 months (around 07/05/2019) for annual CPE (fasting).Earlier if any problems.  Signed:  Santiago BumpersPhil Carely Nappier, MD           01/04/2019

## 2019-01-18 DIAGNOSIS — F4323 Adjustment disorder with mixed anxiety and depressed mood: Secondary | ICD-10-CM | POA: Diagnosis not present

## 2019-02-22 DIAGNOSIS — F4323 Adjustment disorder with mixed anxiety and depressed mood: Secondary | ICD-10-CM | POA: Diagnosis not present

## 2019-04-15 DIAGNOSIS — F4323 Adjustment disorder with mixed anxiety and depressed mood: Secondary | ICD-10-CM | POA: Diagnosis not present

## 2019-06-01 ENCOUNTER — Telehealth: Payer: Self-pay | Admitting: Family Medicine

## 2019-06-01 NOTE — Telephone Encounter (Signed)
LM for pt to CB regarding having labs done. 

## 2019-06-01 NOTE — Telephone Encounter (Signed)
Patient left VM on front desk asking for a CMA to call her back. She has questions about getting some lab work done.

## 2019-06-02 DIAGNOSIS — F4323 Adjustment disorder with mixed anxiety and depressed mood: Secondary | ICD-10-CM | POA: Diagnosis not present

## 2019-06-02 NOTE — Telephone Encounter (Signed)
LM for pt to CB regarding having labs done.

## 2019-07-01 DIAGNOSIS — Z113 Encounter for screening for infections with a predominantly sexual mode of transmission: Secondary | ICD-10-CM | POA: Diagnosis not present

## 2019-07-01 DIAGNOSIS — Z01419 Encounter for gynecological examination (general) (routine) without abnormal findings: Secondary | ICD-10-CM | POA: Diagnosis not present

## 2019-07-01 DIAGNOSIS — Z6829 Body mass index (BMI) 29.0-29.9, adult: Secondary | ICD-10-CM | POA: Diagnosis not present

## 2019-07-08 ENCOUNTER — Encounter: Payer: BLUE CROSS/BLUE SHIELD | Admitting: Family Medicine

## 2019-07-12 ENCOUNTER — Other Ambulatory Visit: Payer: Self-pay | Admitting: Family Medicine

## 2019-07-19 ENCOUNTER — Encounter: Payer: BC Managed Care – PPO | Admitting: Family Medicine

## 2019-07-19 DIAGNOSIS — Z0289 Encounter for other administrative examinations: Secondary | ICD-10-CM

## 2019-07-19 NOTE — Progress Notes (Deleted)
Office Note 07/19/2019  CC: No chief complaint on file.   HPI:  Heather Buchanan is a 53 y.o. White female who is here for annual health maintenance exam.   Past Medical History:  Diagnosis Date  . Arthritis   . Asthmatic bronchitis    "every winter"  . Borderline hypercholesterolemia 07/2018   10 yr Framingham CV risk= 1.8%.  . Gastroparesis    "stomach paresis" per pt's words.  W/u done but specifics unknown by pt.  She also says a re-eval was done and she was told she no longer has "stomach paresis"-->normal gastric emptying scan in EMR from 2007.  Dietary changes help.  Stress and OVEReating worsens it.  Marland Kitchen GERD with esophagitis    2003 bx.  Gastric--no H pylori. SMall bowel and rectum bx: mild inflammatory cell infiltrate but no other abnormalities:   . History of adenomatous polyp of colon    Polyp is remote past (early 2000's?) per pt.  Most recent colonoscopy normal 05/2018--recall 10 yrs per pt (Dr. Earlean Shawl).  . Hypertension    Lisin 06/2018-->allergy.  BP normal w/out meds after (as of 07/21/18).  . Hypothyroidism   . IFG (impaired fasting glucose) 07/2018   Gluc 107, HbA1c 5.5%.  . Microhematuria 06/2018   Urol w/u (CT and cystoscopy) NEG/NL (Dr. Gloriann Loan).  . Urine incontinence     Past Surgical History:  Procedure Laterality Date  . CESAREAN SECTION  2003 and 2004  . COLONOSCOPY  05/2018    Family History  Problem Relation Age of Onset  . Arthritis Mother   . Hyperlipidemia Mother   . Hypertension Mother   . Hearing loss Father   . Depression Sister   . Bipolar disorder Sister     Social History   Socioeconomic History  . Marital status: Married    Spouse name: Not on file  . Number of children: Not on file  . Years of education: Not on file  . Highest education level: Not on file  Occupational History  . Not on file  Social Needs  . Financial resource strain: Not on file  . Food insecurity    Worry: Not on file    Inability: Not on file  .  Transportation needs    Medical: Not on file    Non-medical: Not on file  Tobacco Use  . Smoking status: Never Smoker  . Smokeless tobacco: Never Used  Substance and Sexual Activity  . Alcohol use: Yes  . Drug use: Never  . Sexual activity: Not Currently  Lifestyle  . Physical activity    Days per week: Not on file    Minutes per session: Not on file  . Stress: Not on file  Relationships  . Social Herbalist on phone: Not on file    Gets together: Not on file    Attends religious service: Not on file    Active member of club or organization: Not on file    Attends meetings of clubs or organizations: Not on file    Relationship status: Not on file  . Intimate partner violence    Fear of current or ex partner: Not on file    Emotionally abused: Not on file    Physically abused: Not on file    Forced sexual activity: Not on file  Other Topics Concern  . Not on file  Social History Narrative   Married, 4 children.  All [employer].   She has identical twin--HTN, DM, obesity.  [place]ig from [place]   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   No tob.   Alc: occ.    Outpatient Medications Prior to Visit  Medication Sig Dispense Refill  . BLISOVI 24 FE 1-20 MG-MCG(24) tablet Take 1 tablet by mouth every evening.   0  . buPROPion (WELLBUTRIN XL) 300 MG 24 hr tablet Take 1 tablet by mouth daily.  4  . hydrochlorothiazide (HYDRODIURIL) 25 MG tablet TAKE 1 TABLET BY MOUTH EVERY DAY 90 tablet 1  . Ibuprofen-diphenhydrAMINE HCl (ADVIL PM) 200-25 MG CAPS Take 2 capsules by mouth at bedtime.    Marland Kitchen. levothyroxine (SYNTHROID, LEVOTHROID) 88 MCG tablet Take 88 mcg by mouth daily.  4  . LORazepam (ATIVAN) 0.5 MG tablet 1 tab po bid prn anxiety and/or insomnia 30 tablet 2  . rizatriptan (MAXALT) 10 MG tablet Take 1 tablet (10 mg total) by mouth as needed for migraine. 10 tablet 1  . sertraline (ZOLOFT) 100 MG tablet Take 100 mg by mouth every evening.   4   No  facility-administered medications prior to visit.     Allergies  Allergen Reactions  . Barbiturates Other (See Comments)    Porphyria  . Codeine Nausea And Vomiting  . Lisinopril Other (See Comments)    Hypersalivation, but feeling of dry mouth/abnormal taste, some mouth sores, some feeling of slightly swollen neck glands. No angioedema/anaphylaxis sx's.    ROS *** PE; There were no vitals taken for this visit. *** Pertinent labs:  Lab Results  Component Value Date   TSH 1.87 07/21/2018   Lab Results  Component Value Date   WBC 6.4 10/08/2018   HGB 13.6 10/08/2018   HCT 42.4 10/08/2018   MCV 93.4 10/08/2018   PLT 244 10/08/2018   Lab Results  Component Value Date   CREATININE 0.80 10/19/2018   BUN 25 (H) 10/19/2018   NA 139 10/19/2018   K 3.8 10/19/2018   CL 104 10/19/2018   CO2 27 10/19/2018   Lab Results  Component Value Date   ALT 10 07/21/2018   AST 11 07/21/2018   ALKPHOS 67 07/21/2018   BILITOT 0.5 07/21/2018   Lab Results  Component Value Date   CHOL 244 (H) 07/21/2018   Lab Results  Component Value Date   HDL 50.50 07/21/2018   Lab Results  Component Value Date   LDLCALC 165 (H) 07/21/2018   Lab Results  Component Value Date   TRIG 143.0 07/21/2018   Lab Results  Component Value Date   CHOLHDL 5 07/21/2018   Lab Results  Component Value Date   HGBA1C 5.5 07/21/2018    ASSESSMENT AND PLAN:   Health maintenance exam: Reviewed age and gender appropriate health maintenance issues (prudent diet, regular exercise, health risks of tobacco and excessive alcohol, use of seatbelts, fire alarms in home, use of sunscreen).  Also reviewed age and gender appropriate health screening as well as vaccine recommendations. Vaccines: UTD.  Shingrix discussed-->*** Labs: HP labs+A1c (IFG). Cervical ca screening: Breast ca screening: most recent mammogram in EMR is 07/25/17-->she sees Dr. Rana SnareLowe (GYN)-->*** Colon ca screening: next screening colonoscopy  due 2029.  An After Visit Summary was printed and given to the patient.  FOLLOW UP:  No follow-ups on file.  Signed:  Santiago BumpersPhil McGowen, MD           07/19/2019

## 2019-07-21 DIAGNOSIS — F4323 Adjustment disorder with mixed anxiety and depressed mood: Secondary | ICD-10-CM | POA: Diagnosis not present

## 2019-09-27 DIAGNOSIS — Z1231 Encounter for screening mammogram for malignant neoplasm of breast: Secondary | ICD-10-CM | POA: Diagnosis not present

## 2019-11-25 IMAGING — CR DG CHEST 2V
2 series · 2 of 2 positions shown · non-contrast
Comparison: None.

CLINICAL DATA: Chest tightness

EXAM:
CHEST - 2 VIEW

[chest pa]
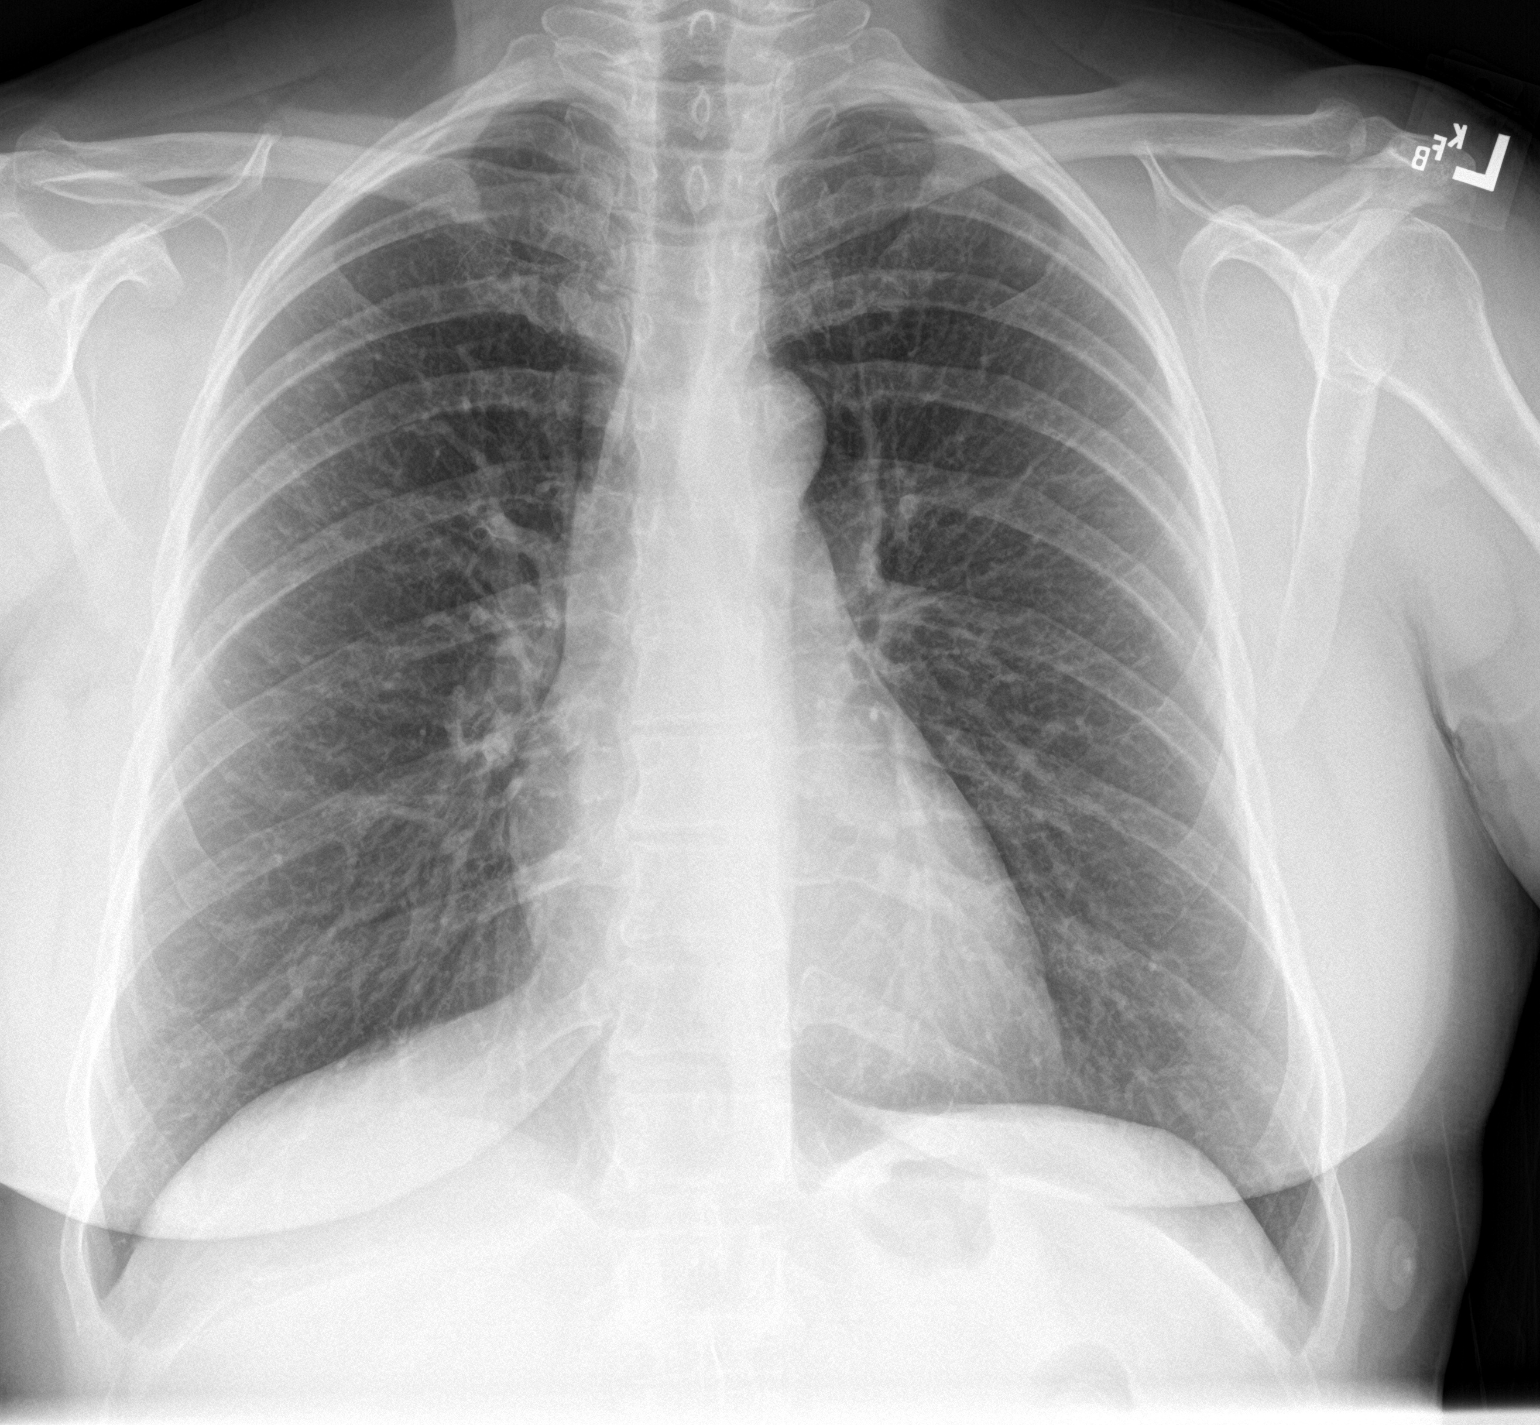

[chest lat]
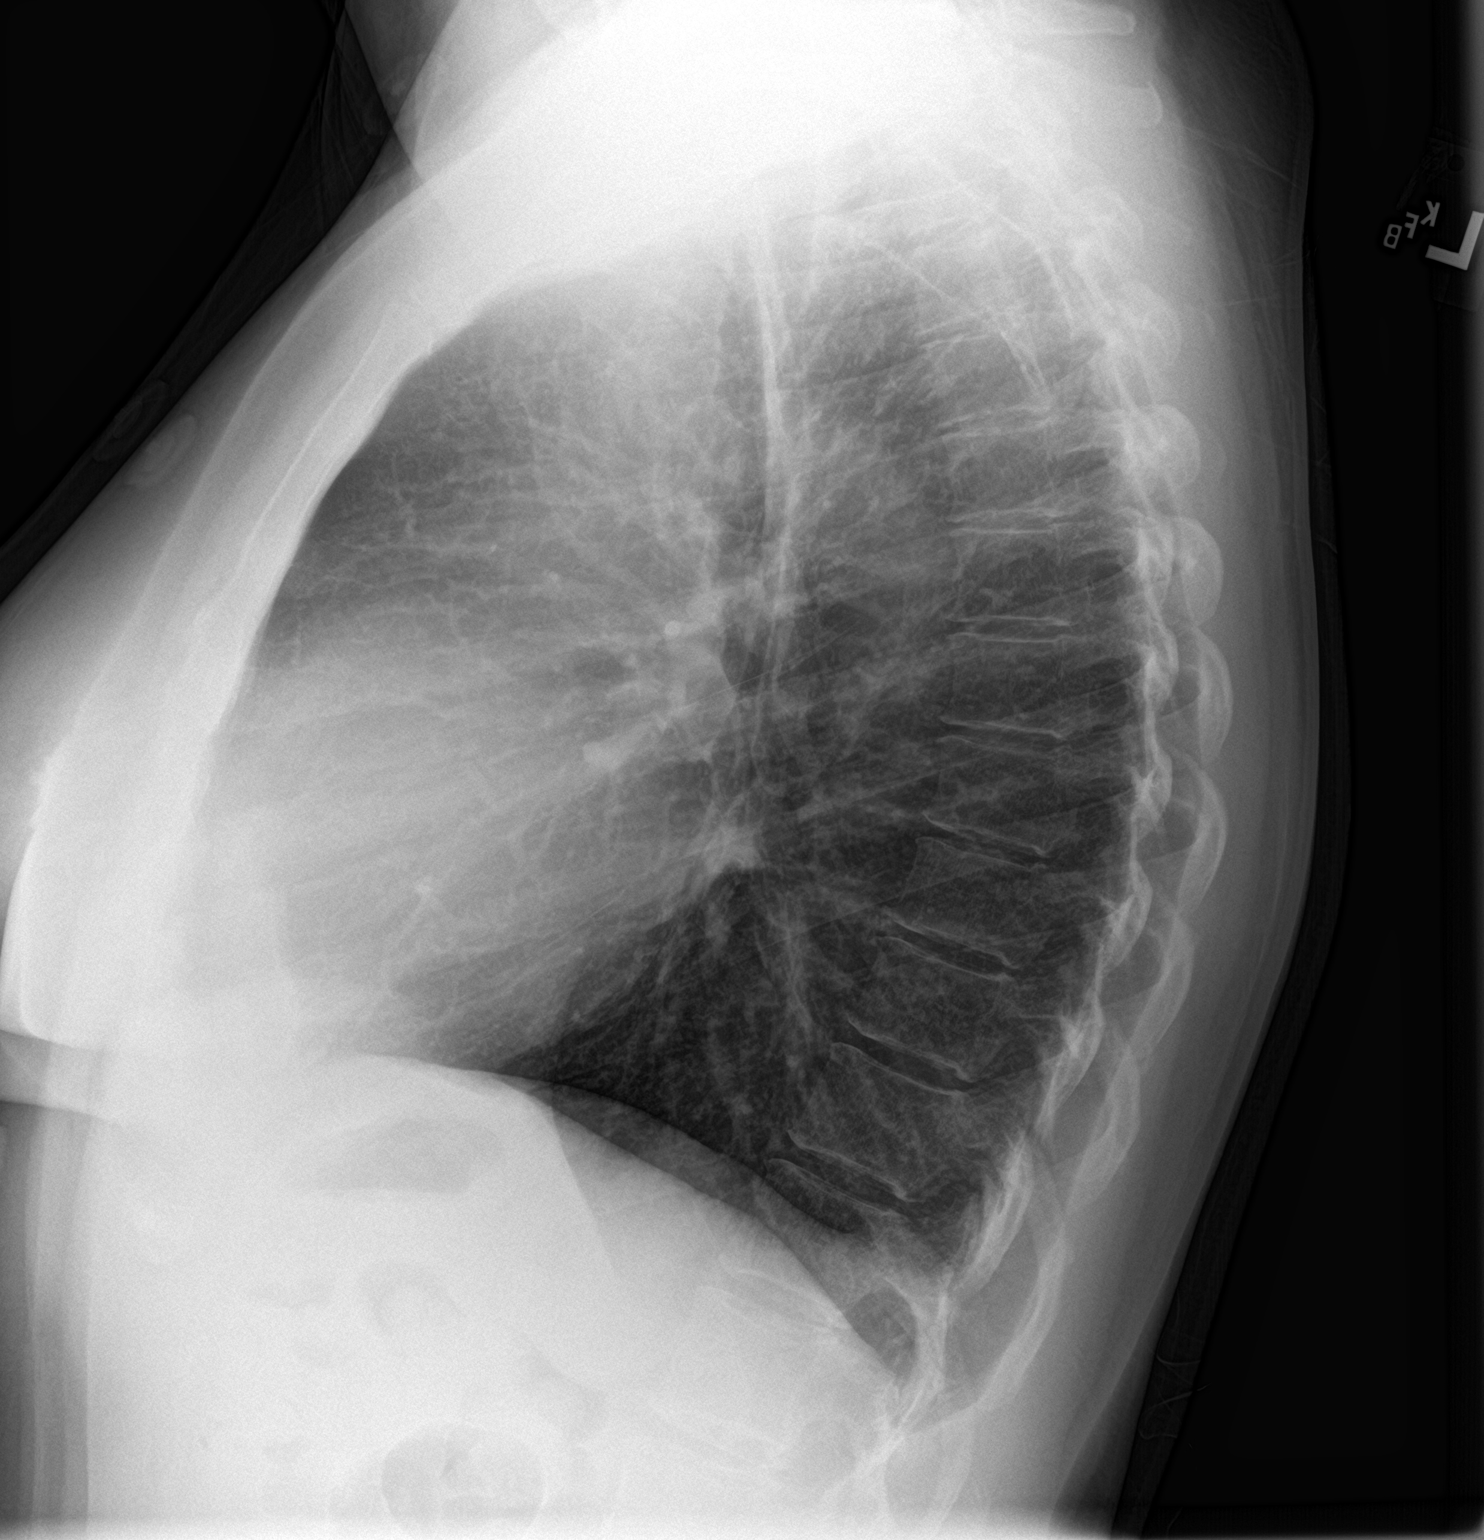

[2 of 2 positions shown; findings below may reference images not displayed]

FINDINGS: Lungs are clear. Heart size and pulmonary vascularity are normal. No
adenopathy. No pneumothorax. No bone lesions.
IMPRESSION: No edema or consolidation.

## 2020-01-08 ENCOUNTER — Other Ambulatory Visit: Payer: Self-pay | Admitting: Family Medicine

## 2020-02-01 ENCOUNTER — Other Ambulatory Visit: Payer: Self-pay | Admitting: Family Medicine

## 2020-02-16 ENCOUNTER — Other Ambulatory Visit: Payer: Self-pay | Admitting: Family Medicine

## 2020-04-20 ENCOUNTER — Other Ambulatory Visit: Payer: Self-pay | Admitting: Family Medicine

## 2020-05-27 ENCOUNTER — Other Ambulatory Visit: Payer: Self-pay | Admitting: Family Medicine

## 2020-05-30 NOTE — Telephone Encounter (Signed)
Patient need to schedule an ov for more refills. Pt was due for CPE 06/2019.

## 2020-06-12 ENCOUNTER — Other Ambulatory Visit: Payer: Self-pay | Admitting: Family Medicine

## 2020-07-27 DIAGNOSIS — Z79890 Hormone replacement therapy (postmenopausal): Secondary | ICD-10-CM | POA: Diagnosis not present

## 2020-07-27 DIAGNOSIS — N950 Postmenopausal bleeding: Secondary | ICD-10-CM | POA: Diagnosis not present

## 2020-08-02 DIAGNOSIS — N950 Postmenopausal bleeding: Secondary | ICD-10-CM | POA: Diagnosis not present

## 2020-08-17 DIAGNOSIS — N950 Postmenopausal bleeding: Secondary | ICD-10-CM | POA: Diagnosis not present

## 2020-08-17 DIAGNOSIS — R9389 Abnormal findings on diagnostic imaging of other specified body structures: Secondary | ICD-10-CM | POA: Diagnosis not present

## 2020-08-28 DIAGNOSIS — D259 Leiomyoma of uterus, unspecified: Secondary | ICD-10-CM | POA: Diagnosis not present

## 2020-08-28 DIAGNOSIS — N939 Abnormal uterine and vaginal bleeding, unspecified: Secondary | ICD-10-CM | POA: Diagnosis not present

## 2020-08-28 DIAGNOSIS — R102 Pelvic and perineal pain: Secondary | ICD-10-CM | POA: Diagnosis not present

## 2020-10-09 DIAGNOSIS — N939 Abnormal uterine and vaginal bleeding, unspecified: Secondary | ICD-10-CM | POA: Diagnosis not present

## 2020-10-09 DIAGNOSIS — N9489 Other specified conditions associated with female genital organs and menstrual cycle: Secondary | ICD-10-CM | POA: Diagnosis not present

## 2020-10-09 DIAGNOSIS — D251 Intramural leiomyoma of uterus: Secondary | ICD-10-CM | POA: Diagnosis not present

## 2020-10-09 DIAGNOSIS — D259 Leiomyoma of uterus, unspecified: Secondary | ICD-10-CM | POA: Diagnosis not present

## 2020-10-09 DIAGNOSIS — N938 Other specified abnormal uterine and vaginal bleeding: Secondary | ICD-10-CM | POA: Diagnosis not present

## 2020-10-09 DIAGNOSIS — N840 Polyp of corpus uteri: Secondary | ICD-10-CM | POA: Diagnosis not present

## 2020-10-09 DIAGNOSIS — N736 Female pelvic peritoneal adhesions (postinfective): Secondary | ICD-10-CM | POA: Diagnosis not present

## 2020-10-09 DIAGNOSIS — N800 Endometriosis of uterus: Secondary | ICD-10-CM | POA: Diagnosis not present

## 2020-10-09 DIAGNOSIS — R102 Pelvic and perineal pain: Secondary | ICD-10-CM | POA: Diagnosis not present

## 2021-01-19 ENCOUNTER — Other Ambulatory Visit: Payer: Self-pay | Admitting: Family Medicine

## 2022-01-07 ENCOUNTER — Emergency Department (HOSPITAL_BASED_OUTPATIENT_CLINIC_OR_DEPARTMENT_OTHER)
Admission: EM | Admit: 2022-01-07 | Discharge: 2022-01-07 | Disposition: A | Discharge status: Home / Self Care | Payer: BC Managed Care – PPO | Attending: Emergency Medicine | Admitting: Emergency Medicine

## 2022-01-07 ENCOUNTER — Other Ambulatory Visit: Payer: Self-pay

## 2022-01-07 ENCOUNTER — Encounter (HOSPITAL_BASED_OUTPATIENT_CLINIC_OR_DEPARTMENT_OTHER): Payer: Self-pay

## 2022-01-07 DIAGNOSIS — R509 Fever, unspecified: Secondary | ICD-10-CM | POA: Diagnosis present

## 2022-01-07 DIAGNOSIS — U071 COVID-19: Secondary | ICD-10-CM | POA: Diagnosis not present

## 2022-01-07 DIAGNOSIS — J45909 Unspecified asthma, uncomplicated: Secondary | ICD-10-CM | POA: Insufficient documentation

## 2022-01-07 LAB — BASIC METABOLIC PANEL
Anion gap: 9 (ref 5–15)
BUN: 16 mg/dL (ref 6–20)
CO2: 24 mmol/L (ref 22–32)
Calcium: 8.7 mg/dL — ABNORMAL LOW (ref 8.9–10.3)
Chloride: 104 mmol/L (ref 98–111)
Creatinine, Ser: 0.88 mg/dL (ref 0.44–1.00)
GFR, Estimated: >60 mL/min (ref >60)
Glucose, Bld: 113 mg/dL — ABNORMAL HIGH (ref 70–99)
Potassium: 3.8 mmol/L (ref 3.5–5.1)
Sodium: 137 mmol/L (ref 135–145)

## 2022-01-07 LAB — CBC WITH DIFFERENTIAL/PLATELET
Abs Immature Granulocytes: 0.02 10*3/uL (ref 0.00–0.07)
Basophils Absolute: 0 10*3/uL (ref 0.0–0.1)
Basophils Relative: 0 %
Eosinophils Absolute: 0 10*3/uL (ref 0.0–0.5)
Eosinophils Relative: 0 %
HCT: 37.8 % (ref 36.0–46.0)
Hemoglobin: 13 g/dL (ref 12.0–15.0)
Immature Granulocytes: 0 %
Lymphocytes Relative: 24 %
Lymphs Abs: 1.6 10*3/uL (ref 0.7–4.0)
MCH: 32.2 pg (ref 26.0–34.0)
MCHC: 34.4 g/dL (ref 30.0–36.0)
MCV: 93.6 fL (ref 80.0–100.0)
Monocytes Absolute: 0.8 10*3/uL (ref 0.1–1.0)
Monocytes Relative: 12 %
Neutro Abs: 4.2 10*3/uL (ref 1.7–7.7)
Neutrophils Relative %: 64 %
Platelets: 150 10*3/uL (ref 150–400)
RBC: 4.04 MIL/uL (ref 3.87–5.11)
RDW: 12.3 % (ref 11.5–15.5)
WBC: 6.6 10*3/uL (ref 4.0–10.5)
nRBC: 0 % (ref 0.0–0.2)

## 2022-01-07 LAB — RESP PANEL BY RT-PCR (FLU A&B, COVID) ARPGX2
Influenza A by PCR: NEGATIVE
Influenza B by PCR: NEGATIVE
SARS Coronavirus 2 by RT PCR: POSITIVE — AB

## 2022-01-07 MED ORDER — ACETAMINOPHEN 500 MG PO TABS
1000.0000 mg | ORAL_TABLET | Freq: Once | ORAL | Status: AC
  Administered 2022-01-07: 1000 mg via ORAL
  Filled 2022-01-07: qty 2

## 2022-01-07 MED ORDER — NIRMATRELVIR/RITONAVIR (PAXLOVID)TABLET: 3.0000 | ORAL_TABLET | Freq: Two times a day (BID) | ORAL | 0 refills | Status: AC

## 2022-01-07 NOTE — ED Triage Notes (Signed)
Pt c/o flu like sx x 3 days with +covid home test-NAD-steady gait

## 2022-01-07 NOTE — Discharge Instructions (Signed)
If you develop high fever, severe cough or cough with blood, trouble breathing, severe headache, neck pain/stiffness, vomiting, or any other new/concerning symptoms then return to the ER for evaluation  

## 2022-01-07 NOTE — ED Provider Notes (Signed)
[facility] EMERGENCY DEPARTMENT Provider Note   CSN: [id number] Arrival date & time: 01/07/22  1919     History  Chief Complaint  Patient presents with   Covid Positive    Heather Buchanan is a 56 y.o. female.  HPI 56 year old female presents with positive COVID test.  She states that she has had a fever, sore throat, loss of voice, cough, congestion.  All of the symptoms started 2 days ago.  Has a history of asthma/bronchitis.  No shortness of breath.   Home Medications Prior to Admission medications   Medication Sig Start Date End Date Taking? Authorizing Provider  nirmatrelvir/ritonavir EUA (PAXLOVID) 20 x 150 MG & 10 x 100MG  TABS Take 3 tablets by mouth 2 (two) times daily for 5 days. Patient GFR is >60. Take nirmatrelvir (150 mg) two tablets twice daily for 5 days and ritonavir (100 mg) one tablet twice daily for 5 days. 01/07/22 01/12/22 Yes 03/12/22, MD  BLISOVI 24 FE 1-20 MG-MCG(24) tablet Take 1 tablet by mouth every evening.  05/13/18   [provider]  buPROPion (WELLBUTRIN XL) 300 MG 24 hr tablet Take 1 tablet by mouth daily. 05/12/18   [provider]  hydrochlorothiazide (HYDRODIURIL) 25 MG tablet TAKE 1 TABLET BY MOUTH EVERY DAY 02/16/20   McGowen, 04/17/20, MD  Ibuprofen-diphenhydrAMINE HCl (ADVIL PM) 200-25 MG CAPS Take 2 capsules by mouth at bedtime.    [provider]  levothyroxine (SYNTHROID, LEVOTHROID) 88 MCG tablet Take 88 mcg by mouth daily. 04/15/18   [provider]  LORazepam (ATIVAN) 0.5 MG tablet 1 tab po bid prn anxiety and/or insomnia 01/04/19   McGowen, 01/06/19, MD  rizatriptan (MAXALT) 10 MG tablet Take 1 tablet (10 mg total) by mouth as needed for migraine. 01/04/19   McGowen, 01/06/19, MD  sertraline (ZOLOFT) 100 MG tablet Take 100 mg by mouth every evening.  05/05/18   [provider]      Allergies    Barbiturates, Codeine, and Lisinopril    Review of Systems   Review of Systems   Constitutional:  Positive for chills and fever.  HENT:  Positive for congestion, sore throat and voice change.   Respiratory:  Positive for cough. Negative for shortness of breath.    Physical Exam Updated Vital Signs BP (!) 142/74 (BP Location: Left Arm)    Pulse 91    Temp 99.2 F (37.3 C) (Oral)    Resp 16    Ht 5\' 2"  (1.575 m)    Wt 69.9 kg    SpO2 94%    BMI 28.17 kg/m  Physical Exam Vitals and nursing note reviewed.  Constitutional:      General: She is not in acute distress.    Appearance: She is well-developed. She is not ill-appearing or diaphoretic.  HENT:     Head: Normocephalic and atraumatic.     Mouth/Throat:     Pharynx: Posterior oropharyngeal erythema (mild) present.     Comments: Soft voice/whisper. No stridor Cardiovascular:     Rate and Rhythm: Normal rate and regular rhythm.     Heart sounds: Normal heart sounds.  Pulmonary:     Effort: Pulmonary effort is normal.     Breath sounds: Normal breath sounds. No wheezing, rhonchi or rales.  Abdominal:     General: There is no distension.  Skin:    General: Skin is warm and dry.  Neurological:     Mental Status: She is alert.  ED Results / Procedures / Treatments   Labs (all labs ordered are listed, but only abnormal results are displayed) Labs Reviewed  BASIC METABOLIC PANEL - Abnormal; Notable for the following components:      Result Value   Glucose, Bld 113 (*)    Calcium 8.7 (*)    All other components within normal limits  RESP PANEL BY RT-PCR (FLU A&B, COVID) ARPGX2  CBC WITH DIFFERENTIAL/PLATELET    EKG None  Radiology No results found.  Procedures Procedures    Medications Ordered in ED Medications  acetaminophen (TYLENOL) tablet 1,000 mg (1,000 mg Oral Given 01/07/22 2030)    ED Course/ Medical Decision Making/ A&P                           Medical Decision Making Amount and/or Complexity of Data Reviewed Labs: ordered.  Risk OTC drugs.   Patient is well-appearing  besides a symptomatic sore throat and hoarseness.  However I have very low suspicion for airway compromise.  She is otherwise well-appearing with a low-grade fever but no other significant vital sign abnormalities besides mild hypertension.  Given her history of asthma I think she needs to be treated with antiviral treatment such as Paxlovid.  She agrees to this.  Her labs were obtained which show a normal renal function and normal WBC.  No indication for chest x-ray given the clear lungs bilaterally.  No hypoxia.  Will discharge home with return precautions.        Final Clinical Impression(s) / ED Diagnoses Final diagnoses:  COVID-19    Rx / DC Orders ED Discharge Orders          Ordered    nirmatrelvir/ritonavir EUA (PAXLOVID) 20 x 150 MG & 10 x 100MG  TABS  2 times daily        01/07/22 2135              2136, MD 01/07/22 2140

## 2022-06-28 ENCOUNTER — Emergency Department (HOSPITAL_BASED_OUTPATIENT_CLINIC_OR_DEPARTMENT_OTHER): Payer: BC Managed Care – PPO

## 2022-06-28 ENCOUNTER — Emergency Department (HOSPITAL_BASED_OUTPATIENT_CLINIC_OR_DEPARTMENT_OTHER)
Admission: EM | Admit: 2022-06-28 | Discharge: 2022-06-28 | Disposition: A | Discharge status: Home / Self Care | Payer: BC Managed Care – PPO | Attending: Emergency Medicine | Admitting: Emergency Medicine

## 2022-06-28 ENCOUNTER — Encounter (HOSPITAL_BASED_OUTPATIENT_CLINIC_OR_DEPARTMENT_OTHER): Payer: Self-pay | Admitting: Emergency Medicine

## 2022-06-28 ENCOUNTER — Other Ambulatory Visit: Payer: Self-pay

## 2022-06-28 DIAGNOSIS — R1013 Epigastric pain: Secondary | ICD-10-CM | POA: Diagnosis not present

## 2022-06-28 DIAGNOSIS — Z79899 Other long term (current) drug therapy: Secondary | ICD-10-CM | POA: Insufficient documentation

## 2022-06-28 DIAGNOSIS — R112 Nausea with vomiting, unspecified: Secondary | ICD-10-CM

## 2022-06-28 DIAGNOSIS — R109 Unspecified abdominal pain: Secondary | ICD-10-CM | POA: Diagnosis present

## 2022-06-28 DIAGNOSIS — R935 Abnormal findings on diagnostic imaging of other abdominal regions, including retroperitoneum: Secondary | ICD-10-CM | POA: Diagnosis not present

## 2022-06-28 DIAGNOSIS — E039 Hypothyroidism, unspecified: Secondary | ICD-10-CM | POA: Diagnosis not present

## 2022-06-28 LAB — COMPREHENSIVE METABOLIC PANEL
ALT: 15 U/L (ref 0–44)
AST: 19 U/L (ref 15–41)
Albumin: 4 g/dL (ref 3.5–5.0)
Alkaline Phosphatase: 66 U/L (ref 38–126)
Anion gap: 9 (ref 5–15)
BUN: 19 mg/dL (ref 6–20)
CO2: 25 mmol/L (ref 22–32)
Calcium: 8.7 mg/dL — ABNORMAL LOW (ref 8.9–10.3)
Chloride: 107 mmol/L (ref 98–111)
Creatinine, Ser: 0.81 mg/dL (ref 0.44–1.00)
GFR, Estimated: >60 mL/min (ref >60)
Glucose, Bld: 123 mg/dL — ABNORMAL HIGH (ref 70–99)
Potassium: 3.9 mmol/L (ref 3.5–5.1)
Sodium: 141 mmol/L (ref 135–145)
Total Bilirubin: 0.6 mg/dL (ref 0.3–1.2)
Total Protein: 7.5 g/dL (ref 6.5–8.1)

## 2022-06-28 LAB — URINALYSIS, ROUTINE W REFLEX MICROSCOPIC
Bilirubin Urine: NEGATIVE
Glucose, UA: NEGATIVE mg/dL
Ketones, ur: NEGATIVE mg/dL
Leukocytes,Ua: NEGATIVE
Nitrite: NEGATIVE
Protein, ur: NEGATIVE mg/dL
Specific Gravity, Urine: 1.015 (ref 1.005–1.030)
pH: 7.5 (ref 5.0–8.0)

## 2022-06-28 LAB — URINALYSIS, MICROSCOPIC (REFLEX)
Squamous Epithelial / HPF: NONE SEEN (ref 0–5)
WBC, UA: NONE SEEN WBC/hpf (ref 0–5)

## 2022-06-28 LAB — CBC WITH DIFFERENTIAL/PLATELET
Abs Immature Granulocytes: 0.08 10*3/uL — ABNORMAL HIGH (ref 0.00–0.07)
Basophils Absolute: 0 10*3/uL (ref 0.0–0.1)
Basophils Relative: 0 %
Eosinophils Absolute: 0.1 10*3/uL (ref 0.0–0.5)
Eosinophils Relative: 0 %
HCT: 41.6 % (ref 36.0–46.0)
Hemoglobin: 14.2 g/dL (ref 12.0–15.0)
Immature Granulocytes: 0 %
Lymphocytes Relative: 25 %
Lymphs Abs: 4.7 10*3/uL — ABNORMAL HIGH (ref 0.7–4.0)
MCH: 31.1 pg (ref 26.0–34.0)
MCHC: 34.1 g/dL (ref 30.0–36.0)
MCV: 91 fL (ref 80.0–100.0)
Monocytes Absolute: 1.1 10*3/uL — ABNORMAL HIGH (ref 0.1–1.0)
Monocytes Relative: 6 %
Neutro Abs: 13.1 10*3/uL — ABNORMAL HIGH (ref 1.7–7.7)
Neutrophils Relative %: 69 %
Platelets: 255 10*3/uL (ref 150–400)
RBC: 4.57 MIL/uL (ref 3.87–5.11)
RDW: 12.4 % (ref 11.5–15.5)
WBC: 19.1 10*3/uL — ABNORMAL HIGH (ref 4.0–10.5)
nRBC: 0 % (ref 0.0–0.2)

## 2022-06-28 LAB — LIPASE, BLOOD: Lipase: 31 U/L (ref 11–51)

## 2022-06-28 MED ORDER — SODIUM CHLORIDE 0.9 % IV BOLUS
1000.0000 mL | Freq: Once | INTRAVENOUS | Status: AC
  Administered 2022-06-28: 1000 mL via INTRAVENOUS

## 2022-06-28 MED ORDER — ONDANSETRON HCL 4 MG/2ML IJ SOLN
4.0000 mg | Freq: Once | INTRAMUSCULAR | Status: AC
  Administered 2022-06-28: 4 mg via INTRAVENOUS
  Filled 2022-06-28: qty 2

## 2022-06-28 MED ORDER — ONDANSETRON HCL 4 MG PO TABS: 4.0000 mg | ORAL_TABLET | ORAL | 0 refills | Status: DC | PRN

## 2022-06-28 MED ORDER — IOHEXOL 300 MG/ML  SOLN
100.0000 mL | Freq: Once | INTRAMUSCULAR | Status: AC | PRN
  Administered 2022-06-28: 100 mL via INTRAVENOUS

## 2022-06-28 MED ORDER — DICYCLOMINE HCL 20 MG PO TABS: 20.0000 mg | ORAL_TABLET | Freq: Two times a day (BID) | ORAL | 0 refills | Status: AC

## 2022-06-28 MED ORDER — SUCRALFATE 1 G PO TABS: 1.0000 g | ORAL_TABLET | Freq: Three times a day (TID) | ORAL | 0 refills | Status: AC

## 2022-06-28 MED ORDER — FAMOTIDINE IN NACL 20-0.9 MG/50ML-% IV SOLN
20.0000 mg | Freq: Once | INTRAVENOUS | Status: AC
Start: 2022-06-28 — End: 2022-06-28
  Administered 2022-06-28: 20 mg via INTRAVENOUS
  Filled 2022-06-28: qty 50

## 2022-06-28 NOTE — ED Provider Notes (Signed)
[place] EMERGENCY DEPARTMENT Provider Note   CSN: [id number] Arrival date & time: 06/28/22  [id number]     History {Add pertinent medical, surgical, social history, OB history to HPI:1} Chief Complaint  Patient presents with   Abdominal Pain    Heather Buchanan is a 56 y.o. female.  Patient as above with significant medical history as below, including arthritis, hypertension, GERD, esophagitis, "stomach paresis" who presents to the ED with complaint of abdominal pain, nausea and vomiting.  Patient reports approximately 1:30 AM this morning she began to have abdominal distention, right upper quadrant pain, nausea and vomiting.  Multiple episodes of emesis.  Nonbloody nonbilious.  No diarrhea.  No chest pain or dyspnea.  No recent dietary changes, no suspicious p.o. intake, no recent travel or sick contacts.  Similar episode in the past that resolved spontaneously.  No medications prior to arrival.  Reports alternating fevers and chills associated with episodes of vomiting     Past Medical History:  Diagnosis Date   Arthritis    Asthmatic bronchitis    "every winter"   Borderline hypercholesterolemia 07/2018   10 yr Framingham CV risk= 1.8%.   Gastroparesis    "stomach paresis" per pt's words.  W/u done but specifics unknown by pt.  She also says a re-eval was done and she was told she no longer has "stomach paresis"-->normal gastric emptying scan in EMR from 2007.  Dietary changes help.  Stress and OVEReating worsens it.   GERD with esophagitis    2003 bx.  Gastric--no H pylori. SMall bowel and rectum bx: mild inflammatory cell infiltrate but no other abnormalities:    History of adenomatous polyp of colon    Polyp is remote past (early 2000's?) per pt.  Most recent colonoscopy normal 05/2018--recall 10 yrs per pt (Dr. Earlean Shawl).   Hypertension    Lisin 06/2018-->allergy.  BP normal w/out meds after (as of 07/21/18).   Hypothyroidism    IFG (impaired fasting glucose) 07/2018    Gluc 107, HbA1c 5.5%.   Microhematuria 06/2018   Urol w/u (CT and cystoscopy) NEG/NL (Dr. Gloriann Loan).   Urine incontinence     Past Surgical History:  Procedure Laterality Date   CESAREAN SECTION  2003 and 2004   COLONOSCOPY  05/2018     The history is provided by the patient. No language interpreter was used.  Abdominal Pain Associated symptoms: chills, fever, nausea and vomiting   Associated symptoms: no chest pain, no cough, no dysuria and no shortness of breath        Home Medications Prior to Admission medications   Medication Sig Start Date End Date Taking? Authorizing Provider  BLISOVI 24 FE 1-20 MG-MCG(24) tablet Take 1 tablet by mouth every evening.  05/13/18   [provider]  buPROPion (WELLBUTRIN XL) 300 MG 24 hr tablet Take 1 tablet by mouth daily. 05/12/18   [provider]  hydrochlorothiazide (HYDRODIURIL) 25 MG tablet TAKE 1 TABLET BY MOUTH EVERY DAY 02/16/20   McGowen, Adrian Blackwater, MD  Ibuprofen-diphenhydrAMINE HCl (ADVIL PM) 200-25 MG CAPS Take 2 capsules by mouth at bedtime.    [provider]  levothyroxine (SYNTHROID, LEVOTHROID) 88 MCG tablet Take 88 mcg by mouth daily. 04/15/18   [provider]  LORazepam (ATIVAN) 0.5 MG tablet 1 tab po bid prn anxiety and/or insomnia 01/04/19   McGowen, Adrian Blackwater, MD  rizatriptan (MAXALT) 10 MG tablet Take 1 tablet (10 mg total) by mouth as needed for migraine. 01/04/19   Shawnie Dapper  H, MD  sertraline (ZOLOFT) 100 MG tablet Take 100 mg by mouth every evening.  05/05/18   [provider]      Allergies    Barbiturates, Codeine, and Lisinopril    Review of Systems   Review of Systems  Constitutional:  Positive for chills and fever. Negative for activity change.  HENT:  Negative for facial swelling and trouble swallowing.   Eyes:  Negative for discharge and redness.  Respiratory:  Negative for cough and shortness of breath.   Cardiovascular:  Negative for chest pain and palpitations.   Gastrointestinal:  Positive for abdominal pain, nausea and vomiting.  Genitourinary:  Negative for dysuria and flank pain.  Musculoskeletal:  Negative for back pain and gait problem.  Skin:  Negative for pallor and rash.  Neurological:  Negative for syncope and headaches.    Physical Exam Updated Vital Signs Temp 98.3 F (36.8 C) (Oral)   Ht 5\' 2"  (1.575 m)   Wt 74.8 kg   BMI 30.18 kg/m  Physical Exam Vitals and nursing note reviewed.  Constitutional:      General: She is not in acute distress.    Appearance: Normal appearance. She is well-developed. She is not ill-appearing or diaphoretic.  HENT:     Head: Normocephalic and atraumatic.     Right Ear: External ear normal.     Left Ear: External ear normal.     Nose: Nose normal.     Mouth/Throat:     Mouth: Mucous membranes are moist.  Eyes:     General: No scleral icterus.       Right eye: No discharge.        Left eye: No discharge.  Cardiovascular:     Rate and Rhythm: Normal rate and regular rhythm.     Pulses: Normal pulses.     Heart sounds: Normal heart sounds.  Pulmonary:     Effort: Pulmonary effort is normal. No respiratory distress.     Breath sounds: Normal breath sounds.  Abdominal:     General: Abdomen is flat.     Palpations: Abdomen is soft.     Tenderness: There is abdominal tenderness in the right upper quadrant. There is no guarding or rebound.  Musculoskeletal:        General: Normal range of motion.     Cervical back: Normal range of motion.     Right lower leg: No edema.     Left lower leg: No edema.  Skin:    General: Skin is warm and dry.     Capillary Refill: Capillary refill takes less than 2 seconds.  Neurological:     Mental Status: She is alert.  Psychiatric:        Mood and Affect: Mood normal.        Behavior: Behavior normal.     ED Results / Procedures / Treatments   Labs (all labs ordered are listed, but only abnormal results are displayed) Labs Reviewed  CBC WITH  DIFFERENTIAL/PLATELET - Abnormal; Notable for the following components:      Result Value   WBC 19.1 (*)    Neutro Abs 13.1 (*)    Lymphs Abs 4.7 (*)    Monocytes Absolute 1.1 (*)    Abs Immature Granulocytes 0.08 (*)    All other components within normal limits  COMPREHENSIVE METABOLIC PANEL  LIPASE, BLOOD  URINALYSIS, ROUTINE W REFLEX MICROSCOPIC    EKG None  Radiology No results found.  Procedures Procedures  {Document cardiac  monitor, telemetry assessment procedure when appropriate:1}  Medications Ordered in ED Medications  sodium chloride 0.9 % bolus 1,000 mL (has no administration in time range)  famotidine (PEPCID) IVPB 20 mg premix (has no administration in time range)  ondansetron (ZOFRAN) injection 4 mg (4 mg Intravenous Given 06/28/22 0534)    ED Course/ Medical Decision Making/ A&P                           Medical Decision Making Amount and/or Complexity of Data Reviewed Labs: ordered. Radiology: ordered.  Risk Prescription drug management.    CC: Abdominal pain, nausea vomiting  This patient presents to the Emergency Department for the above complaint. This involves an extensive number of treatment options and is a complaint that carries with it a high risk of complications and morbidity. Vital signs were reviewed. Serious etiologies considered.  Differential diagnosis includes but is not exclusive to acute cholecystitis, intrathoracic causes for epigastric abdominal pain, gastritis, duodenitis, pancreatitis, small bowel or large bowel obstruction, abdominal aortic aneurysm, hernia, gastritis, etc.  Record review:  Previous records obtained and reviewed prior ED visit, prior office visit, prior labs and imaging  Additional history obtained from friend at bedside  Medical and surgical history as noted above.   Work up as above, notable for:  Labs & imaging results that were available during my care of the patient were visualized by me and  considered in my medical decision making.  Physical exam as above.   I ordered imaging studies which included CT abdomen pelvis with IV contrast. I visualized the imaging, interpreted images, and I agree with radiologist interpretation. ***  Cardiac monitoring reviewed and interpreted personally which shows N/A   Personally discussed patient care with consultant; ***  Management: IV fluids, Pepcid, Zofran  ED Course:     Reassessment:  ***  Admission was considered.                Social determinants of health include -  Social History   Socioeconomic History   Marital status: Single    Spouse name: Not on file   Number of children: Not on file   Years of education: Not on file   Highest education level: Not on file  Occupational History   Not on file  Tobacco Use   Smoking status: Never   Smokeless tobacco: Never  Vaping Use   Vaping Use: Never used  Substance and Sexual Activity   Alcohol use: Yes    Comment: occ   Drug use: Never   Sexual activity: Not Currently    Birth control/protection: Surgical  Other Topics Concern   Not on file  Social History Narrative   Married, 4 children.  All [employer].   She has identical twin--HTN, DM, obesity.   Orig from [place].   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   No tob.   Alc: occ.   Social Determinants of Health   Financial Resource Strain: Not on file  Food Insecurity: Not on file  Transportation Needs: Not on file  Physical Activity: Not on file  Stress: Not on file  Social Connections: Not on file  Intimate Partner Violence: Not on file      This chart was dictated using voice recognition software.  Despite best efforts to proofread,  errors can occur which can change the documentation meaning.   {Document critical care time when appropriate:1} {Document review of labs and  clinical decision tools ie heart score, Chads2Vasc2 etc:1}  {Document your independent  review of radiology images, and any outside records:1} {Document your discussion with family members, caretakers, and with consultants:1} {Document social determinants of health affecting pt's care:1} {Document your decision making why or why not admission, treatments were needed:1} Final Clinical Impression(s) / ED Diagnoses Final diagnoses:  None    Rx / DC Orders ED Discharge Orders     None

## 2022-06-28 NOTE — ED Notes (Signed)
De 

## 2022-06-28 NOTE — ED Notes (Addendum)
Denies nausea. Given gingerale for fluid challenge, visitor at bedside

## 2022-06-28 NOTE — Discharge Instructions (Addendum)
Obtain repeat CT imaging of your abdomen in 3 months for evaluation of enlarged lymph node   There are many causes of abdominal pain. Most pain is not serious and goes away, but some pain gets worse, changes, or will not go away. Please return to the emergency department or see your doctor right away if you (or your family member) experience any of the following:  1. Pain that gets worse or moves to just one spot.  2. Pain that gets worse if you cough or sneeze.  3. Pain with going over a bump in the road.  4. Pain that does not get better in 24 hours.  5. Inability to keep down liquids (vomiting)-especially if you are making less urine.  6. Fainting.  7. Blood in the vomit or stool.  8. High fever or shaking chills.  9. Swelling of the abdomen.  10. Any new or worsening problem.      Follow-up Instructions  See your primary care provider if not completely better in the next 2-3 days. Come to the ED if you are unable to see them in this time frame.    Additional Instructions  No alcohol.  No caffeine, aspirin, or cigarettes.   Please return to the emergency department immediately for any new or concerning symptoms, or if you get worse.

## 2022-06-28 NOTE — ED Provider Notes (Signed)
Signout from Dr. Wallace Cullens.  56 year old female complaining of some upper abdominal pain nausea vomiting.  Lab work showing elevated white count.  CT does not show any acute findings other than enlarged intra-abdominal lymph node.  Plan is to follow-up on urinalysis.  Anticipate can be discharged to follow-up outpatient with symptomatic treatment. Physical Exam  Temp 98.3 F (36.8 C) (Oral)   Ht 5\' 2"  (1.575 m)   Wt 74.8 kg   BMI 30.18 kg/m   Physical Exam  Procedures  Procedures  ED Course / MDM   Clinical Course as of 06/28/22 0710  Fri Jun 28, 2022  0554 WBC(!): 19.1 [SG]  0554 NEUT#(!): 13.1 [SG]  0554 Temp: 98.3 F (36.8 C) Patient TTP to right upper quadrant/epigastrium, obtain CT abdomen pelvis. [SG]  Jun 30, 2022 Pt is s/p hysterectomy [SG]  0653 2. Isolated mildly enlarged hepatoduodenal ligament lymph node, nonspecific and likely reactive. Follow-up CT in 3 months recommended to ensure stability.   [SG]    Clinical Course User Index [SG] [id number], DO   Medical Decision Making Amount and/or Complexity of Data Reviewed Labs: ordered. Decision-making details documented in ED Course. Radiology: ordered.  Risk Prescription drug management.   Patient's urinalysis unremarkable.  She has been tolerating p.o.  Reviewed results of work-up with her.  Have placed a referral into GI and she was given contact information for primary care follow-up.  Return instructions discussed       Sloan Leiter, MD 06/28/22 [phone]

## 2022-06-28 NOTE — ED Notes (Addendum)
Awaiting ua results

## 2022-06-28 NOTE — ED Triage Notes (Signed)
Pt c/o vomiting and abd distention.

## 2022-07-04 NOTE — Progress Notes (Signed)
Referring Provider: No ref. provider found Primary Care Physician:  Jeoffrey Massed, MD  Reason for Consultation: Epigastric pain and enlarged lymph nodes   IMPRESSION:  Acute on chronic nausea, vomiting, and abdominal pain     -No alarm features    -Not explained by CT scan 06/2022 Dark stools with possible melena, no associated anemia  Abnormal CT of the abdomen and pelvis    -Isolated 13 mm hepatoduodenal ligament lymph node on colonoscopy 06/28/2022    -No prior abdominal imaging for comparison Leukocytosis seen on recent ER labs Grade a reflux esophagitis on EGD 2003 Chronic gastritis without H. pylori on EGD 2003 History of "stomach pararesis" with subsequent normal gastric emptying scan 2007 Colon cancer screening. Last colonoscopy 2019 with Dr. Kinnie Scales Family history of celiac (niece)  PLAN: - EGD to evaluate for esophagitis, gastritis, malignancy - Start pantoprazole 40 mg QAM for at least 8 weeks - Avoid NSAIDs including Advil - CT scan of the abdomen and pelvis with and without contrast 09/2022 to follow-up on abnormal CT 7/23 - HIDA with CCK if EGD is nondiagnostic - Follow-up with Dr. Milinda Cave to evaluate for non-GI causes of nausea - Colonoscopy for colon cancer surveillance due 2029, earlier with new symptoms  HPI: Heather Buchanan is a 56 y.o. female referred by the ER for further evaluation of abdominal pain and an abnormal CT scan. The history is obtained through the patient and review of her electronic health record.   She has had care from multiple prior gastroenterologist including a physician in [facility], Dr. Kinnie Scales, and even Dr. Leone Payor and Dr. Corinda Gubler in 2007.  Medical history is significant for hypertension, arthritis, borderline hypercholesterolemia, hypothyroidism, chronic headaches, asthma, anxiety, impaired fasting glucose. Seen by urology for hematuria without a clear diagnosis. Prior C-section x 2.   History of salmonella many years ago  requiring a 2 week hospitalization. After that infection, her GI tract has never been the same. She had an EGD and a colonoscopy in 2003 in [place] to evaluate abdominal pain, nausea, vomiting, and diarrhea.  EGD revealed grade a reflux esophagitis, gastritis, and suspected motility abnormalities.  The performing physician raise suspicion for bile reflux.  Gastric biopsies were negative for H. pylori.  Colonoscopy revealed a spastic colon and rectal irritation thought to be due to prep artifact.  Small bowel and rectal biopsies showed some mild inflammatory cell infiltrate.  Internal hemorrhoids were seen biopsies were obtained from the colon and ileum.  Previous diagnosis of gastroparesis.  She said that her gastroenterologist in [place] diagnosed her with gastroparesis. She presented with epigastric pain and vomiting.  An ultrasound on June 04, 2006, was normal, and a gastric emptying study in 2007 (scanned in Epic as an orthopedic study).  Celiac serologies ordered by Dr. Leone Payor were normal. Treatment with domperidone discussed. She has managed her symptoms with small meals. Symptoms worsened with stress and overeating.  There is a previous history of sludge on gallbladder ultrasound without biliary colic.  Most recently had a colonoscopy with Dr. Kinnie Scales 05/2018 that was normal.  Surveillance recommended in 10 years.  Seen in the ER 06/28/2022 for upper abdominal abdominal pain and nausea.  White count was elevated.  Liver enzymes and lipase were normal.  CT of the abdomen and pelvis with contrast 06/28/2022 showed no acute findings.  Isolated mildly enlarged hepatoduodenal ligament lymph node that was thought to be reactive was identified.  Follow-up CT in 3 months recommended by the radiologist.  Reports one  month of near daily nausea with associated emesis and intermittent upper abdominal pain. Some improvement in symptoms with Zofran, Bentyl, and Carafate. She is concerned about her  gallbladder and pancreatitis contributing to her symptoms. Has frequent heartburn. Some dark if not black stools.   Eats multiple small meals daily. Eating bland and vegetable free diet since she was in the ED.   Uses Tylenol and Advil as needed. Denies other NSAIDs.   Identical twin sister with similar symptoms. Sister has had a cholecystectomy. Niece with celiac disease. There is no known family history of colon cancer or polyps. No family history of stomach cancer or other GI malignancy. No family history of inflammatory bowel disease or celiac.    Past Medical History:  Diagnosis Date   Arthritis    Asthmatic bronchitis    "every winter"   Borderline hypercholesterolemia 07/2018   10 yr Framingham CV risk= 1.8%.   Gastroparesis    "stomach paresis" per pt's words.  W/u done but specifics unknown by pt.  She also says a re-eval was done and she was told she no longer has "stomach paresis"-->normal gastric emptying scan in EMR from 2007.  Dietary changes help.  Stress and OVEReating worsens it.   GERD with esophagitis    2003 bx.  Gastric--no H pylori. SMall bowel and rectum bx: mild inflammatory cell infiltrate but no other abnormalities:    History of adenomatous polyp of colon    Polyp is remote past (early 2000's?) per pt.  Most recent colonoscopy normal 05/2018--recall 10 yrs per pt (Dr. Kinnie Scales).   Hypertension    Lisin 06/2018-->allergy.  BP normal w/out meds after (as of 07/21/18).   Hypothyroidism    IFG (impaired fasting glucose) 07/2018   Gluc 107, HbA1c 5.5%.   Microhematuria 06/2018   Urol w/u (CT and cystoscopy) NEG/NL (Dr. Alvester Morin).   Urine incontinence     Past Surgical History:  Procedure Laterality Date   CESAREAN SECTION  2003 and 2004   COLONOSCOPY  05/2018    Current Outpatient Medications  Medication Sig Dispense Refill   BLISOVI 24 FE 1-20 MG-MCG(24) tablet Take 1 tablet by mouth every evening.   0   buPROPion (WELLBUTRIN XL) 300 MG 24 hr tablet Take 1  tablet by mouth daily.  4   dicyclomine (BENTYL) 20 MG tablet Take 1 tablet (20 mg total) by mouth 2 (two) times daily for 7 days. 14 tablet 0   Ibuprofen-diphenhydrAMINE HCl (ADVIL PM) 200-25 MG CAPS Take 2 capsules by mouth at bedtime.     levothyroxine (SYNTHROID, LEVOTHROID) 88 MCG tablet Take 88 mcg by mouth daily.  4   LORazepam (ATIVAN) 0.5 MG tablet 1 tab po bid prn anxiety and/or insomnia 30 tablet 2   ondansetron (ZOFRAN) 4 MG tablet Take 1 tablet (4 mg total) by mouth every 4 (four) hours as needed for nausea or vomiting. 12 tablet 0   pantoprazole (PROTONIX) 40 MG tablet Take 1 tablet (40 mg total) by mouth daily. 90 tablet 3   rizatriptan (MAXALT) 10 MG tablet Take 1 tablet (10 mg total) by mouth as needed for migraine. 10 tablet 1   sertraline (ZOLOFT) 100 MG tablet Take 100 mg by mouth every evening.   4   sucralfate (CARAFATE) 1 g tablet Take 1 tablet (1 g total) by mouth 4 (four) times daily -  with meals and at bedtime for 7 days. 28 tablet 0   hydrochlorothiazide (HYDRODIURIL) 25 MG tablet TAKE 1 TABLET BY MOUTH  EVERY DAY (Patient not taking: Reported on 07/05/2022) 30 tablet 0   No current facility-administered medications for this visit.    Allergies as of 07/05/2022 - Review Complete 07/05/2022  Allergen Reaction Noted   Barbiturates Other (See Comments) 07/06/2018   Codeine Nausea And Vomiting 07/06/2018   Lisinopril Other (See Comments) 07/16/2018    Family History  Problem Relation Age of Onset   Arthritis Mother    Hyperlipidemia Mother    Hypertension Mother    Hearing loss Father    Depression Sister    Bipolar disorder Sister    Colon cancer Neg Hx    Esophageal cancer Neg Hx     Social History   Socioeconomic History   Marital status: Single    Spouse name: Not on file   Number of children: Not on file   Years of education: Not on file   Highest education level: Not on file  Occupational History   Not on file  Tobacco Use   Smoking status:  Never   Smokeless tobacco: Never  Vaping Use   Vaping Use: Never used  Substance and Sexual Activity   Alcohol use: Yes    Comment: occ   Drug use: Never   Sexual activity: Not Currently    Birth control/protection: Surgical  Other Topics Concern   Not on file  Social History Narrative   Married, 4 children.  All [employer].   She has identical twin--HTN, DM, obesity.   Orig from [place].   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   No tob.   Alc: occ.   Social Determinants of Health   Financial Resource Strain: Not on file  Food Insecurity: Not on file  Transportation Needs: Not on file  Physical Activity: Not on file  Stress: Not on file  Social Connections: Not on file  Intimate Partner Violence: Not on file    Review of Systems: 12 system ROS is negative except as noted above with the addition of seasonal allergies, anxiety, back pain, vision changes, fatigue, headaches, insomnia, urinary incontinence with coughing or sneezing.Marland Kitchen   Physical Exam: General:   Alert,  well-nourished, pleasant and cooperative in NAD Head:  Normocephalic and atraumatic. Eyes:  Sclera clear, no icterus.   Conjunctiva pink. Ears:  Normal auditory acuity. Nose:  No deformity, discharge,  or lesions. Mouth:  No deformity or lesions.   Neck:  Supple; no masses or thyromegaly. Lungs:  Clear throughout to auscultation.   No wheezes. Heart:  Regular rate and rhythm; no murmurs. Abdomen:  Soft, nontender, nondistended, normal bowel sounds, no rebound or guarding. No hepatosplenomegaly.   Rectal:  Deferred  Msk:  Symmetrical. No boney deformities LAD: No inguinal or umbilical LAD Extremities:  No clubbing or edema. Neurologic:  Alert and  oriented x4;  grossly nonfocal Skin:  Intact without significant lesions or rashes. Psych:  Alert and cooperative. Normal mood and affect.     Austen Oyster L. Orvan Falconer, MD, MPH 07/05/2022, 5:33 PM

## 2022-07-05 ENCOUNTER — Ambulatory Visit: Payer: Self-pay | Admitting: Gastroenterology

## 2022-07-05 ENCOUNTER — Encounter: Payer: Self-pay | Admitting: Gastroenterology

## 2022-07-05 VITALS — BP 130/74 | HR 82 | Ht 62.0 in | Wt 155.1 lb

## 2022-07-05 DIAGNOSIS — R112 Nausea with vomiting, unspecified: Secondary | ICD-10-CM

## 2022-07-05 DIAGNOSIS — R109 Unspecified abdominal pain: Secondary | ICD-10-CM

## 2022-07-05 MED ORDER — PANTOPRAZOLE SODIUM 40 MG PO TBEC: 40.0000 mg | DELAYED_RELEASE_TABLET | Freq: Every day | ORAL | 3 refills | Status: DC

## 2022-07-05 NOTE — Patient Instructions (Addendum)
It was a pleasure to meet you today.  Continue to follow your lifestyle modifications for reflux.   Add pantoprazole 40 mg every morning. I would like you to take this for at least 8 weeks to see if this have improve your symptoms.   I recommended an upper endoscopy. If this is negative, will proceed with additional evaluation of your gallbladder.  I recommend another CT scan in October to follow-up on your slightly large lymph node seen on the CT scan from the ED.

## 2022-07-11 ENCOUNTER — Other Ambulatory Visit: Payer: Self-pay | Admitting: Gastroenterology

## 2022-07-11 ENCOUNTER — Encounter: Payer: BC Managed Care – PPO | Admitting: Gastroenterology

## 2022-07-11 ENCOUNTER — Ambulatory Visit (AMBULATORY_SURGERY_CENTER): Payer: BC Managed Care – PPO | Admitting: Gastroenterology

## 2022-07-11 ENCOUNTER — Encounter: Payer: Self-pay | Admitting: Gastroenterology

## 2022-07-11 VITALS — BP 134/84 | HR 75 | Temp 98.0°F | Resp 16 | Ht 62.0 in | Wt 155.0 lb

## 2022-07-11 DIAGNOSIS — K3189 Other diseases of stomach and duodenum: Secondary | ICD-10-CM | POA: Diagnosis not present

## 2022-07-11 DIAGNOSIS — K2980 Duodenitis without bleeding: Secondary | ICD-10-CM | POA: Diagnosis not present

## 2022-07-11 DIAGNOSIS — R112 Nausea with vomiting, unspecified: Secondary | ICD-10-CM

## 2022-07-11 MED ORDER — SODIUM CHLORIDE 0.9 % IV SOLN: 500.0000 mL | Freq: Once | INTRAVENOUS | Status: DC

## 2022-07-11 NOTE — Progress Notes (Signed)
Called to room to assist during endoscopic procedure.  Patient ID and intended procedure confirmed with present staff. Received instructions for my participation in the procedure from the performing physician.  

## 2022-07-11 NOTE — Progress Notes (Signed)
1516 Robinul 0.1 mg IV given due large amount of secretions upon assessment.  MD made aware, vss  °

## 2022-07-11 NOTE — Progress Notes (Signed)
Report given to PACU, vss 

## 2022-07-11 NOTE — Progress Notes (Signed)
Indication for upper endoscopy: Nausea, vomiting, and abdominal pain  Please see my 07/05/2022 office visit for complete details.  There is been no change in history or physical exam since that time.  She remains an appropriate candidate for monitored anesthesia care in the endoscopy center.

## 2022-07-11 NOTE — Op Note (Signed)
[facility] Patient Name: Heather Buchanan Procedure Date: 07/11/2022 3:06 PM MRN: [id number] Endoscopist: Tressia Danas MD, MD Age: 56 Referring MD:  Date of Birth: May 13, 1966 Gender: Female Account #: [id number] Procedure:                Upper GI endoscopy Indications:              Abdominal pain, Nausea with vomiting, Isolated 6mm                            hepatoduodenal ligament lymph node on CT 06/28/22 Medicines:                Monitored Anesthesia Care Procedure:                Pre-Anesthesia Assessment:                           - Prior to the procedure, a History and Physical                            was performed, and patient medications and                            allergies were reviewed. The patient's tolerance of                            previous anesthesia was also reviewed. The risks                            and benefits of the procedure and the sedation                            options and risks were discussed with the patient.                            All questions were answered, and informed consent                            was obtained. Prior Anticoagulants: The patient has                            taken no previous anticoagulant or antiplatelet                            agents. ASA Grade Assessment: II - A patient with                            mild systemic disease. After reviewing the risks                            and benefits, the patient was deemed in                            satisfactory condition to undergo the procedure.  After obtaining informed consent, the endoscope was                            passed under direct vision. Throughout the                            procedure, the patient's blood pressure, pulse, and                            oxygen saturations were monitored continuously. The                            GIF [id number] #[id number] was introduced through the                             mouth, and advanced to the third part of duodenum.                            The upper GI endoscopy was accomplished without                            difficulty. The patient tolerated the procedure                            well. Scope In: Scope Out: Findings:                 The z-line is located 36 cm from the incisors and                            irregular. The mucous is erythematous, congested,                            and there is a 2cm tongue. No other visible                            abnormalities were present. The maximum                            longitudinal extent of these esophageal mucosal                            changes was 2 cm in length. Biopsies were taken                            with a cold forceps for histology. Estimated blood                            loss was minimal.                           Patchy mild mucosal changes characterized by                            erythema were  found in the gastric body. Biopsies                            were taken from the antrum, body, and fundus with a                            cold forceps for histology. Estimated blood loss                            was minimal.                           The examined duodenum was normal. Biopsies were                            taken with a cold forceps for histology. Estimated                            blood loss was minimal.                           The cardia and gastric fundus were normal on                            retroflexion.                           The exam was otherwise without abnormality. Complications:            No immediate complications. Estimated Blood Loss:     Estimated blood loss was minimal. Impression:               - Irregular mucosa in the distal esophagus.                            Biopsied.                           - Erythematous mucosa in the gastric body. Biopsied.                           - Normal examined duodenum. Biopsied.                            - The examination was otherwise normal. Recommendation:           - Patient has a contact number available for                            emergencies. The signs and symptoms of potential                            delayed complications were discussed with the                            patient. Return to normal activities tomorrow.  Written discharge instructions were provided to the                            patient.                           - Resume previous diet.                           - Continue present medications.                           - Await pathology results.                           - No aspirin, ibuprofen, naproxen, or other                            non-steroidal anti-inflammatory drugs.                           - Office follow-up to review these results. Tressia Danas MD, MD 07/11/2022 3:45:08 PM This report has been signed electronically.

## 2022-07-11 NOTE — Patient Instructions (Signed)
Await pathology results.  No aspirin, ibuprofen, naproxen, or other non-steroidal anti-inflammatory drugs.  YOU HAD AN ENDOSCOPIC PROCEDURE TODAY AT THE [facility]:   Refer to the procedure report that was given to you for any specific questions about what was found during the examination.  If the procedure report does not answer your questions, please call your gastroenterologist to clarify.  If you requested that your care partner not be given the details of your procedure findings, then the procedure report has been included in a sealed envelope for you to review at your convenience later.  YOU SHOULD EXPECT: Some feelings of bloating in the abdomen. Passage of more gas than usual.  Walking can help get rid of the air that was put into your GI tract during the procedure and reduce the bloating. If you had a lower endoscopy (such as a colonoscopy or flexible sigmoidoscopy) you may notice spotting of blood in your stool or on the toilet paper. If you underwent a bowel prep for your procedure, you may not have a normal bowel movement for a few days.  Please Note:  You might notice some irritation and congestion in your nose or some drainage.  This is from the oxygen used during your procedure.  There is no need for concern and it should clear up in a day or so.  SYMPTOMS TO REPORT IMMEDIATELY:  Following upper endoscopy (EGD)  Vomiting of blood or coffee ground material  New chest pain or pain under the shoulder blades  Painful or persistently difficult swallowing  New shortness of breath  Fever of 100F or higher  Black, tarry-looking stools  For urgent or emergent issues, a gastroenterologist can be reached at any hour by calling [phone]. Do not use MyChart messaging for urgent concerns.    DIET:  We do recommend a small meal at first, but then you may proceed to your regular diet.  Drink plenty of fluids but you should avoid alcoholic beverages for 24  hours.  ACTIVITY:  You should plan to take it easy for the rest of today and you should NOT DRIVE or use heavy machinery until tomorrow (because of the sedation medicines used during the test).    FOLLOW UP: Our staff will call the number listed on your records the next business day following your procedure.  We will call around 7:15- 8:00 am to check on you and address any questions or concerns that you may have regarding the information given to you following your procedure. If we do not reach you, we will leave a message.  If you develop any symptoms (ie: fever, flu-like symptoms, shortness of breath, cough etc.) before then, please call [phone].  If you test positive for Covid 19 in the 2 weeks post procedure, please call and report this information to [place].    If any biopsies were taken you will be contacted by phone or by letter within the next 1-3 weeks.  Please call us at [phone] if you have not heard about the biopsies in 3 weeks.    SIGNATURES/CONFIDENTIALITY: You and/or your care partner have signed paperwork which will be entered into your electronic medical record.  These signatures attest to the fact that that the information above on your After Visit Summary has been reviewed and is understood.  Full responsibility of the confidentiality of this discharge information lies with you and/or your care-partner.

## 2022-07-12 ENCOUNTER — Telehealth: Payer: Self-pay

## 2022-07-12 NOTE — Telephone Encounter (Signed)
Attempted to reach patient for post-procedure f/u call. No answer. Left message for her to please not hesitate to call us if she has any questions/concerns regarding her care. 

## 2022-07-15 ENCOUNTER — Telehealth: Payer: Self-pay

## 2022-07-15 NOTE — Telephone Encounter (Signed)
-----   Message from Tressia Danas, MD sent at 07/11/2022  3:35 PM EDT ----- Office follow-up with me - next available whenever that may be.  Thanks.  KLB

## 2022-07-15 NOTE — Telephone Encounter (Signed)
Follow up scheduled for 08/14/22 at 10:50 am with Dr. Orvan Falconer. Pt notified via mychart.

## 2022-07-22 ENCOUNTER — Other Ambulatory Visit: Payer: Self-pay

## 2022-07-22 DIAGNOSIS — R112 Nausea with vomiting, unspecified: Secondary | ICD-10-CM

## 2022-07-22 DIAGNOSIS — R109 Unspecified abdominal pain: Secondary | ICD-10-CM

## 2022-08-14 ENCOUNTER — Ambulatory Visit: Payer: BC Managed Care – PPO | Admitting: Gastroenterology

## 2022-08-22 ENCOUNTER — Encounter (HOSPITAL_COMMUNITY)
Admission: RE | Admit: 2022-08-22 | Discharge: 2022-08-22 | Disposition: A | Discharge status: Home / Self Care | Payer: BC Managed Care – PPO | Source: Ambulatory Visit | Attending: Gastroenterology | Admitting: Gastroenterology

## 2022-08-22 DIAGNOSIS — R109 Unspecified abdominal pain: Secondary | ICD-10-CM | POA: Insufficient documentation

## 2022-08-22 DIAGNOSIS — R112 Nausea with vomiting, unspecified: Secondary | ICD-10-CM | POA: Insufficient documentation

## 2022-08-22 MED ORDER — TECHNETIUM TC 99M MEBROFENIN IV KIT
5.0000 | PACK | Freq: Once | INTRAVENOUS | Status: AC
  Administered 2022-08-22: 5 via INTRAVENOUS

## 2022-09-24 ENCOUNTER — Ambulatory Visit: Payer: Self-pay | Admitting: Surgery

## 2022-09-24 NOTE — H&P (Signed)
History of Present Illness: Heather Buchanan is a 56 y.o. female who was referred to me for evaluation of biliary dyskinesia.  For the last several months, she has been having epigastric and right upper quadrant pain.  It now occurs daily and gets worse after eating.  She has been consuming mostly bland foods in the past few weeks to try to minimize the pain.  She had a CT scan in the ED on 7/21, which incidentally showed a mildly enlarged hepatoduodenal lymph node but no other acute abnormalities.  She was referred to Dr. Tarri Glenn and underwent an EGD on 8/3.  This showed mildly erythematous gastric mucosa but was overall unremarkable.  She then had a HIDA on 9/14 which showed a gallbladder EF of 14%.  She has been referred to discuss cholecystectomy.  Recent LFTs in July were normal.   She has some other GI symptoms including alternating constipation and diarrhea, and says she has previously been diagnosed with gastroparesis many years ago, although per prior prior notes she had a normal gastric emptying study in 2007.   Prior abdominal surgeries include hysterectomy and C-section.     Review of Systems: A complete review of systems was obtained from the patient.  I have reviewed this information and discussed as appropriate with the patient.  See HPI as well for other ROS.       Medical History: Past Medical History Past Medical History: Diagnosis Date  Arthritis    Asthma, unspecified asthma severity, unspecified whether complicated, unspecified whether persistent    Hypertension        Patient Active Problem List Diagnosis  Biliary dyskinesia     Past Surgical History Past Surgical History: Procedure Laterality Date  LAPAROSCOPIC ASSISTED VAGINAL HYSTERECTOMY   2021  CESAREAN SECTION       2003 & 2004      Allergies Allergies Allergen Reactions  Barbiturates Other (See Comments)     Porphyria  Codeine Nausea And Vomiting and Other (See Comments)      Current  Outpatient Medications on File Prior to Visit Medication Sig Dispense Refill  buPROPion (WELLBUTRIN XL) 300 MG XL tablet Take 300 mg by mouth once daily      hydroCHLOROthiazide (HYDRODIURIL) 25 MG tablet Take 25 mg by mouth once daily      levothyroxine (SYNTHROID) 88 MCG tablet Take 88 mcg by mouth once daily      sertraline (ZOLOFT) 100 MG tablet Take 100 mg by mouth once daily       No current facility-administered medications on file prior to visit.     Family History Family History Problem Relation Age of Onset  High blood pressure (Hypertension) Mother    Hyperlipidemia (Elevated cholesterol) Mother    Obesity Sister    Diabetes Sister        Social History   Tobacco Use Smoking Status Never Smokeless Tobacco Never     Social HistoryExpand by [employer] Social History    Socioeconomic History  Marital status: Divorced Tobacco Use  Smoking status: Never  Smokeless tobacco: Never Vaping Use  Vaping Use: Never used Substance and Sexual Activity  Alcohol use: Yes     Alcohol/week: 1.0 standard drink of alcohol     Types: 1 Standard drinks or equivalent per week  Drug use: Never      Objective:     Vitals:   09/24/22 1026 BP: (!) 140/86 Pulse: 98 Temp: 37.3 C (99.2 F) Weight: 71.2 kg (157 lb) Height: 157.5 cm (5'  2")   Body mass index is 28.72 kg/m.   Physical Exam Vitals reviewed.  Constitutional:      General: She is not in acute distress.    Appearance: Normal appearance.  HENT:     Head: Normocephalic and atraumatic.  Eyes:     General: No scleral icterus.    Conjunctiva/sclera: Conjunctivae normal.  Cardiovascular:     Rate and Rhythm: Normal rate and regular rhythm.     Heart sounds: No murmur heard. Pulmonary:     Effort: Pulmonary effort is normal. No respiratory distress.     Breath sounds: Normal breath sounds.  Abdominal:     General: Abdomen is flat. There is no distension.     Palpations: Abdomen is soft.     Tenderness:  There is no abdominal tenderness.  Musculoskeletal:        General: Normal range of motion.  Skin:    General: Skin is warm and dry.     Coloration: Skin is not jaundiced.  Neurological:     General: No focal deficit present.     Mental Status: She is alert and oriented to person, place, and time.  Psychiatric:        Mood and Affect: Mood normal.        Behavior: Behavior normal.        Thought Content: Thought content normal.          Assessment and Plan: Diagnoses and all orders for this visit:   Biliary dyskinesia     This is a 56 year old female referred for evaluation of postprandial right upper quadrant pain in the setting of reduced gallbladder EF.  Extensive work-up has not shown another clear etiology for her pain.  I personally reviewed her imaging, labs, referral notes, and procedure reports.  I recommended proceeding with cholecystectomy, although I counseled this may not entirely relieve all of her GI symptoms as some of her symptoms seem multifactorial.  I do think that this will help relieve her pain and allow her to eat more normally. The details of laparoscopic cholecystectomy were discussed with the patient, including the risks of bleeding, infection, bile leak, and <0.5% risk of common bile duct injury. The patient expressed understanding and agrees to proceed with surgery.  She will be contacted to schedule an elective surgery date.  Michaelle Birks, [facility], Hepatobiliary and Pancreatic Surgery 09/24/22 12:43 PM

## 2022-09-24 NOTE — H&P (View-Only) (Signed)
History of Present Illness: Heather Buchanan is a 56 y.o. female who was referred to me for evaluation of biliary dyskinesia.  For the last several months, she has been having epigastric and right upper quadrant pain.  It now occurs daily and gets worse after eating.  She has been consuming mostly bland foods in the past few weeks to try to minimize the pain.  She had a CT scan in the ED on 7/21, which incidentally showed a mildly enlarged hepatoduodenal lymph node but no other acute abnormalities.  She was referred to Dr. Beavers and underwent an EGD on 8/3.  This showed mildly erythematous gastric mucosa but was overall unremarkable.  She then had a HIDA on 9/14 which showed a gallbladder EF of 14%.  She has been referred to discuss cholecystectomy.  Recent LFTs in July were normal.   She has some other GI symptoms including alternating constipation and diarrhea, and says she has previously been diagnosed with gastroparesis many years ago, although per prior prior notes she had a normal gastric emptying study in 2007.   Prior abdominal surgeries include hysterectomy and C-section.     Review of Systems: A complete review of systems was obtained from the patient.  I have reviewed this information and discussed as appropriate with the patient.  See HPI as well for other ROS.       Medical History: Past Medical History Past Medical History: Diagnosis Date  Arthritis    Asthma, unspecified asthma severity, unspecified whether complicated, unspecified whether persistent    Hypertension        Patient Active Problem List Diagnosis  Biliary dyskinesia     Past Surgical History Past Surgical History: Procedure Laterality Date  LAPAROSCOPIC ASSISTED VAGINAL HYSTERECTOMY   2021  CESAREAN SECTION       2003 & 2004      Allergies Allergies Allergen Reactions  Barbiturates Other (See Comments)     Porphyria  Codeine Nausea And Vomiting and Other (See Comments)      Current  Outpatient Medications on File Prior to Visit Medication Sig Dispense Refill  buPROPion (WELLBUTRIN XL) 300 MG XL tablet Take 300 mg by mouth once daily      hydroCHLOROthiazide (HYDRODIURIL) 25 MG tablet Take 25 mg by mouth once daily      levothyroxine (SYNTHROID) 88 MCG tablet Take 88 mcg by mouth once daily      sertraline (ZOLOFT) 100 MG tablet Take 100 mg by mouth once daily       No current facility-administered medications on file prior to visit.     Family History Family History Problem Relation Age of Onset  High blood pressure (Hypertension) Mother    Hyperlipidemia (Elevated cholesterol) Mother    Obesity Sister    Diabetes Sister        Social History   Tobacco Use Smoking Status Never Smokeless Tobacco Never     Social HistoryExpand by [employer] Social History    Socioeconomic History  Marital status: Divorced Tobacco Use  Smoking status: Never  Smokeless tobacco: Never Vaping Use  Vaping Use: Never used Substance and Sexual Activity  Alcohol use: Yes     Alcohol/week: 1.0 standard drink of alcohol     Types: 1 Standard drinks or equivalent per week  Drug use: Never      Objective:     Vitals:   09/24/22 1026 BP: (!) 140/86 Pulse: 98 Temp: 37.3 C (99.2 F) Weight: 71.2 kg (157 lb) Height: 157.5 cm (5'   2")   Body mass index is 28.72 kg/m.   Physical Exam Vitals reviewed.  Constitutional:      General: She is not in acute distress.    Appearance: Normal appearance.  HENT:     Head: Normocephalic and atraumatic.  Eyes:     General: No scleral icterus.    Conjunctiva/sclera: Conjunctivae normal.  Cardiovascular:     Rate and Rhythm: Normal rate and regular rhythm.     Heart sounds: No murmur heard. Pulmonary:     Effort: Pulmonary effort is normal. No respiratory distress.     Breath sounds: Normal breath sounds.  Abdominal:     General: Abdomen is flat. There is no distension.     Palpations: Abdomen is soft.     Tenderness:  There is no abdominal tenderness.  Musculoskeletal:        General: Normal range of motion.  Skin:    General: Skin is warm and dry.     Coloration: Skin is not jaundiced.  Neurological:     General: No focal deficit present.     Mental Status: She is alert and oriented to person, place, and time.  Psychiatric:        Mood and Affect: Mood normal.        Behavior: Behavior normal.        Thought Content: Thought content normal.          Assessment and Plan: Diagnoses and all orders for this visit:   Biliary dyskinesia     This is a 56 year old female referred for evaluation of postprandial right upper quadrant pain in the setting of reduced gallbladder EF.  Extensive work-up has not shown another clear etiology for her pain.  I personally reviewed her imaging, labs, referral notes, and procedure reports.  I recommended proceeding with cholecystectomy, although I counseled this may not entirely relieve all of her GI symptoms as some of her symptoms seem multifactorial.  I do think that this will help relieve her pain and allow her to eat more normally. The details of laparoscopic cholecystectomy were discussed with the patient, including the risks of bleeding, infection, bile leak, and <0.5% risk of common bile duct injury. The patient expressed understanding and agrees to proceed with surgery.  She will be contacted to schedule an elective surgery date.  Michaelle Birks, [facility], Hepatobiliary and Pancreatic Surgery 09/24/22 12:43 PM

## 2022-09-27 ENCOUNTER — Ambulatory Visit: Payer: BC Managed Care – PPO | Admitting: Gastroenterology

## 2022-10-02 NOTE — Pre-Procedure Instructions (Signed)
Surgical Instructions    Your procedure is scheduled on October 07, 2022.  Report to [facility] Main Entrance "A" at 7:30 A.M., then check in with the Admitting office.  Call this number if you have problems the morning of surgery:  [phone]   If you have any questions prior to your surgery date call [phone]: Open Monday-Friday 8am-4pm    Remember:  Do not eat after midnight the night before your surgery  You may drink clear liquids until 6:30 AM the morning of your surgery.   Clear liquids allowed are: Water, Non-Citrus Juices (without pulp), Carbonated Beverages, Clear Tea, Black Coffee Only (NO MILK, CREAM OR POWDERED CREAMER of any kind), and Gatorade.      Take these medicines the morning of surgery with A SIP OF WATER:  buPROPion (WELLBUTRIN XL)  estradiol (ESTRACE)   levothyroxine (SYNTHROID, LEVOTHROID)   pantoprazole (PROTONIX)  sertraline (ZOLOFT)     As of today, STOP taking any Aspirin (unless otherwise instructed by your surgeon) Aleve, Naproxen, Ibuprofen, Motrin, Advil, Goody's, BC's, all herbal medications, fish oil, and all vitamins.                     Do NOT Smoke (Tobacco/Vaping) for 24 hours prior to your procedure.  If you use a CPAP at night, you may bring your mask/headgear for your overnight stay.   Contacts, glasses, piercing's, hearing aid's, dentures or partials may not be worn into surgery, please bring cases for these belongings.    For patients admitted to the hospital, discharge time will be determined by your treatment team.   Patients discharged the day of surgery will not be allowed to drive home, and someone needs to stay with them for 24 hours.  SURGICAL WAITING ROOM VISITATION Patients having surgery or a procedure may have no more than 2 support people in the waiting area - these visitors may rotate.   Children under the age of 46 must have an adult with them who is not the patient. If the patient needs to stay at the  hospital during part of their recovery, the visitor guidelines for inpatient rooms apply. Pre-op nurse will coordinate an appropriate time for 1 support person to accompany patient in pre-op.  This support person may not rotate.   Please refer to the [facility] website for the visitor guidelines for Inpatients (after your surgery is over and you are in a regular room).    Special instructions:   [place]- Preparing For Surgery  Before surgery, you can play an important role. Because skin is not sterile, your skin needs to be as free of germs as possible. You can reduce the number of germs on your skin by washing with CHG (chlorahexidine gluconate) Soap before surgery.  CHG is an antiseptic cleaner which kills germs and bonds with the skin to continue killing germs even after washing.    Oral Hygiene is also important to reduce your risk of infection.  Remember - BRUSH YOUR TEETH THE MORNING OF SURGERY WITH YOUR REGULAR TOOTHPASTE  Please do not use if you have an allergy to CHG or antibacterial soaps. If your skin becomes reddened/irritated stop using the CHG.  Do not shave (including legs and underarms) for at least 48 hours prior to first CHG shower. It is OK to shave your face.  Please follow these instructions carefully.   Shower the NIGHT BEFORE SURGERY and the MORNING OF SURGERY  If you chose to wash your hair, wash  your hair first as usual with your normal shampoo.  After you shampoo, rinse your hair and body thoroughly to remove the shampoo.  Use CHG Soap as you would any other liquid soap. You can apply CHG directly to the skin and wash gently with a scrungie or a clean washcloth.   Apply the CHG Soap to your body ONLY FROM THE NECK DOWN.  Do not use on open wounds or open sores. Avoid contact with your eyes, ears, mouth and genitals (private parts). Wash Face and genitals (private parts)  with your normal soap.   Wash thoroughly, paying special attention to the area where  your surgery will be performed.  Thoroughly rinse your body with warm water from the neck down.  DO NOT shower/wash with your normal soap after using and rinsing off the CHG Soap.  Pat yourself dry with a CLEAN TOWEL.  Wear CLEAN PAJAMAS to bed the night before surgery  Place CLEAN SHEETS on your bed the night before your surgery  DO NOT SLEEP WITH PETS.   Day of Surgery: Take a shower with CHG soap. Do not wear jewelry or makeup Do not wear lotions, powders, perfumes, or deodorant. Do not shave 48 hours prior to surgery.   Do not bring valuables to the hospital.  [facility] is not responsible for any belongings or valuables. Do not wear nail polish, gel polish, artificial nails, or any other type of covering on natural nails (fingers and toes) If you have artificial nails or gel coating that need to be removed by a nail salon, please have this removed prior to surgery. Artificial nails or gel coating may interfere with anesthesia's ability to adequately monitor your vital signs.  Wear Clean/Comfortable clothing the morning of surgery Remember to brush your teeth WITH YOUR REGULAR TOOTHPASTE.   Please read over the following fact sheets that you were given.    If you received a COVID test during your pre-op visit  it is requested that you wear a mask when out in public, stay away from anyone that may not be feeling well and notify your surgeon if you develop symptoms. If you have been in contact with anyone that has tested positive in the last 10 days please notify you surgeon.

## 2022-10-03 ENCOUNTER — Ambulatory Visit (HOSPITAL_COMMUNITY)
Admission: RE | Admit: 2022-10-03 | Discharge: 2022-10-03 | Disposition: A | Discharge status: Home / Self Care | Payer: Commercial Managed Care - HMO | Source: Ambulatory Visit | Attending: Surgery | Admitting: Surgery

## 2022-10-03 ENCOUNTER — Encounter (HOSPITAL_COMMUNITY): Payer: Self-pay

## 2022-10-03 VITALS — BP 149/76 | HR 79 | Temp 97.8°F | Resp 16 | Ht 62.0 in | Wt 151.8 lb

## 2022-10-03 DIAGNOSIS — I2510 Atherosclerotic heart disease of native coronary artery without angina pectoris: Secondary | ICD-10-CM | POA: Insufficient documentation

## 2022-10-03 DIAGNOSIS — Z01818 Encounter for other preprocedural examination: Secondary | ICD-10-CM | POA: Diagnosis present

## 2022-10-03 HISTORY — DX: Family history of other specified conditions: Z84.89

## 2022-10-03 LAB — CBC
HCT: 40.2 % (ref 36.0–46.0)
Hemoglobin: 13.8 g/dL (ref 12.0–15.0)
MCH: 31.1 pg (ref 26.0–34.0)
MCHC: 34.3 g/dL (ref 30.0–36.0)
MCV: 90.5 fL (ref 80.0–100.0)
Platelets: 246 10*3/uL (ref 150–400)
RBC: 4.44 MIL/uL (ref 3.87–5.11)
RDW: 11.8 % (ref 11.5–15.5)
WBC: 10.5 10*3/uL (ref 4.0–10.5)
nRBC: 0 % (ref 0.0–0.2)

## 2022-10-03 LAB — BASIC METABOLIC PANEL
Anion gap: 8 (ref 5–15)
BUN: 12 mg/dL (ref 6–20)
CO2: 26 mmol/L (ref 22–32)
Calcium: 9.4 mg/dL (ref 8.9–10.3)
Chloride: 104 mmol/L (ref 98–111)
Creatinine, Ser: 0.83 mg/dL (ref 0.44–1.00)
GFR, Estimated: >60 mL/min (ref >60)
Glucose, Bld: 101 mg/dL — ABNORMAL HIGH (ref 70–99)
Potassium: 4.4 mmol/L (ref 3.5–5.1)
Sodium: 138 mmol/L (ref 135–145)

## 2022-10-03 NOTE — Progress Notes (Signed)
PCP - Dr. Shawnie Dapper Cardiologist - Denies  PPM/ICD - Denies Device Orders - n/a Rep Notified - n/a  Chest x-ray - n/a EKG - 10/03/2022 Stress Test - Denies ECHO - Denies Cardiac Cath - Denies  Sleep Study - Denies CPAP - n/a  No DM  Last dose of GLP1 agonist- n/a GLP1 instructions: n/a  Blood Thinner Instructions: n/a Aspirin Instructions: n/a  ERAS Protcol - Yes. Clear liquids until 0430 morning of surgery. PRE-SURGERY Ensure or G2- No. None ordered  COVID TEST- n/a   Anesthesia review: No   Patient denies shortness of breath, fever, cough and chest pain at PAT appointment   All instructions explained to the patient, with a verbal understanding of the material. Patient agrees to go over the instructions while at home for a better understanding. Patient also instructed to self quarantine after being tested for COVID-19. The opportunity to ask questions was provided.

## 2022-10-07 ENCOUNTER — Ambulatory Visit (HOSPITAL_COMMUNITY): Payer: Commercial Managed Care - HMO | Admitting: Certified Registered Nurse Anesthetist

## 2022-10-07 ENCOUNTER — Other Ambulatory Visit: Payer: Self-pay

## 2022-10-07 ENCOUNTER — Ambulatory Visit (HOSPITAL_COMMUNITY)
Admission: RE | Admit: 2022-10-07 | Discharge: 2022-10-07 | Disposition: A | Discharge status: Home / Self Care | Payer: Commercial Managed Care - HMO | Attending: Surgery | Admitting: Surgery

## 2022-10-07 ENCOUNTER — Ambulatory Visit (HOSPITAL_BASED_OUTPATIENT_CLINIC_OR_DEPARTMENT_OTHER): Payer: Commercial Managed Care - HMO | Admitting: Certified Registered Nurse Anesthetist

## 2022-10-07 ENCOUNTER — Encounter (HOSPITAL_COMMUNITY): Payer: Self-pay | Admitting: Surgery

## 2022-10-07 ENCOUNTER — Encounter (HOSPITAL_COMMUNITY)
Admission: RE | Disposition: A | Discharge status: Home / Self Care | Payer: Self-pay | Source: Home / Self Care | Attending: Surgery

## 2022-10-07 DIAGNOSIS — K828 Other specified diseases of gallbladder: Secondary | ICD-10-CM

## 2022-10-07 DIAGNOSIS — E039 Hypothyroidism, unspecified: Secondary | ICD-10-CM | POA: Insufficient documentation

## 2022-10-07 DIAGNOSIS — K811 Chronic cholecystitis: Secondary | ICD-10-CM | POA: Insufficient documentation

## 2022-10-07 DIAGNOSIS — I10 Essential (primary) hypertension: Secondary | ICD-10-CM | POA: Insufficient documentation

## 2022-10-07 HISTORY — PX: CHOLECYSTECTOMY: SHX55

## 2022-10-07 SURGERY — LAPAROSCOPIC CHOLECYSTECTOMY
Anesthesia: General | Site: Abdomen

## 2022-10-07 MED ORDER — EPHEDRINE 5 MG/ML INJ
INTRAVENOUS | Status: AC
  Filled 2022-10-07: qty 5

## 2022-10-07 MED ORDER — CHLORHEXIDINE GLUCONATE 0.12 % MT SOLN
OROMUCOSAL | Status: AC
  Administered 2022-10-07: 15 mL via OROMUCOSAL
  Filled 2022-10-07: qty 15

## 2022-10-07 MED ORDER — KETOROLAC TROMETHAMINE 30 MG/ML IJ SOLN
INTRAMUSCULAR | Status: AC
  Filled 2022-10-07: qty 1

## 2022-10-07 MED ORDER — CEFAZOLIN SODIUM-DEXTROSE 2-4 GM/100ML-% IV SOLN
2.0000 g | INTRAVENOUS | Status: AC
  Administered 2022-10-07: 2 g via INTRAVENOUS
  Filled 2022-10-07: qty 100

## 2022-10-07 MED ORDER — PHENYLEPHRINE 80 MCG/ML (10ML) SYRINGE FOR IV PUSH (FOR BLOOD PRESSURE SUPPORT)
PREFILLED_SYRINGE | INTRAVENOUS | Status: DC | PRN
  Administered 2022-10-07 (×6): 80 ug via INTRAVENOUS

## 2022-10-07 MED ORDER — EPHEDRINE SULFATE-NACL 50-0.9 MG/10ML-% IV SOSY
PREFILLED_SYRINGE | INTRAVENOUS | Status: DC | PRN
  Administered 2022-10-07: 5 mg via INTRAVENOUS
  Administered 2022-10-07: 2.5 mg via INTRAVENOUS
  Administered 2022-10-07: 5 mg via INTRAVENOUS

## 2022-10-07 MED ORDER — HYDROCODONE-ACETAMINOPHEN 5-325 MG PO TABS: 1.0000 | ORAL_TABLET | Freq: Four times a day (QID) | ORAL | 0 refills | Status: AC | PRN

## 2022-10-07 MED ORDER — BUPIVACAINE-EPINEPHRINE 0.25% -1:200000 IJ SOLN
INTRAMUSCULAR | Status: DC | PRN
  Administered 2022-10-07: 30 mL

## 2022-10-07 MED ORDER — MIDAZOLAM HCL 2 MG/2ML IJ SOLN
INTRAMUSCULAR | Status: AC
  Filled 2022-10-07: qty 2

## 2022-10-07 MED ORDER — ACETAMINOPHEN 500 MG PO TABS
1000.0000 mg | ORAL_TABLET | ORAL | Status: AC
  Administered 2022-10-07: 1000 mg via ORAL
  Filled 2022-10-07: qty 2

## 2022-10-07 MED ORDER — SUGAMMADEX SODIUM 200 MG/2ML IV SOLN
INTRAVENOUS | Status: DC | PRN
  Administered 2022-10-07 (×2): 100 mg via INTRAVENOUS

## 2022-10-07 MED ORDER — SODIUM CHLORIDE 0.9 % IR SOLN
Status: DC | PRN
  Administered 2022-10-07 (×2): 1000 mL

## 2022-10-07 MED ORDER — ROCURONIUM BROMIDE 10 MG/ML (PF) SYRINGE
PREFILLED_SYRINGE | INTRAVENOUS | Status: DC | PRN
  Administered 2022-10-07: 60 mg via INTRAVENOUS

## 2022-10-07 MED ORDER — OXYCODONE HCL 5 MG/5ML PO SOLN: 5.0000 mg | Freq: Once | ORAL | Status: DC | PRN

## 2022-10-07 MED ORDER — CHLORHEXIDINE GLUCONATE 0.12 % MT SOLN: 15.0000 mL | Freq: Once | OROMUCOSAL | Status: AC

## 2022-10-07 MED ORDER — LIDOCAINE 2% (20 MG/ML) 5 ML SYRINGE
INTRAMUSCULAR | Status: DC | PRN
  Administered 2022-10-07: 100 mg via INTRAVENOUS

## 2022-10-07 MED ORDER — ONDANSETRON HCL 4 MG/2ML IJ SOLN
INTRAMUSCULAR | Status: AC
  Filled 2022-10-07: qty 2

## 2022-10-07 MED ORDER — BUPIVACAINE-EPINEPHRINE (PF) 0.25% -1:200000 IJ SOLN
INTRAMUSCULAR | Status: AC
  Filled 2022-10-07: qty 30

## 2022-10-07 MED ORDER — OXYCODONE HCL 5 MG PO TABS: 5.0000 mg | ORAL_TABLET | Freq: Once | ORAL | Status: DC | PRN

## 2022-10-07 MED ORDER — FENTANYL CITRATE (PF) 250 MCG/5ML IJ SOLN
INTRAMUSCULAR | Status: DC | PRN
  Administered 2022-10-07: 100 ug via INTRAVENOUS
  Administered 2022-10-07: 50 ug via INTRAVENOUS
  Administered 2022-10-07: 25 ug via INTRAVENOUS

## 2022-10-07 MED ORDER — PROPOFOL 10 MG/ML IV BOLUS
INTRAVENOUS | Status: DC | PRN
  Administered 2022-10-07: 200 mg via INTRAVENOUS

## 2022-10-07 MED ORDER — LACTATED RINGERS IV SOLN: INTRAVENOUS | Status: DC

## 2022-10-07 MED ORDER — ONDANSETRON HCL 4 MG PO TABS: 4.0000 mg | ORAL_TABLET | Freq: Four times a day (QID) | ORAL | 0 refills | Status: AC | PRN

## 2022-10-07 MED ORDER — ORAL CARE MOUTH RINSE: 15.0000 mL | Freq: Once | OROMUCOSAL | Status: AC

## 2022-10-07 MED ORDER — 0.9 % SODIUM CHLORIDE (POUR BTL) OPTIME
TOPICAL | Status: DC | PRN
  Administered 2022-10-07: 1000 mL

## 2022-10-07 MED ORDER — DEXAMETHASONE SODIUM PHOSPHATE 10 MG/ML IJ SOLN
INTRAMUSCULAR | Status: AC
  Filled 2022-10-07: qty 1

## 2022-10-07 MED ORDER — AMISULPRIDE (ANTIEMETIC) 5 MG/2ML IV SOLN
INTRAVENOUS | Status: AC
  Filled 2022-10-07: qty 4

## 2022-10-07 MED ORDER — DEXAMETHASONE SODIUM PHOSPHATE 10 MG/ML IJ SOLN
INTRAMUSCULAR | Status: DC | PRN
  Administered 2022-10-07: 10 mg via INTRAVENOUS

## 2022-10-07 MED ORDER — ROCURONIUM BROMIDE 10 MG/ML (PF) SYRINGE
PREFILLED_SYRINGE | INTRAVENOUS | Status: AC
  Filled 2022-10-07: qty 10

## 2022-10-07 MED ORDER — ONDANSETRON HCL 4 MG/2ML IJ SOLN
INTRAMUSCULAR | Status: DC | PRN
  Administered 2022-10-07: 4 mg via INTRAVENOUS

## 2022-10-07 MED ORDER — KETOROLAC TROMETHAMINE 30 MG/ML IJ SOLN
30.0000 mg | Freq: Once | INTRAMUSCULAR | Status: AC | PRN
  Administered 2022-10-07: 30 mg via INTRAVENOUS

## 2022-10-07 MED ORDER — AMISULPRIDE (ANTIEMETIC) 5 MG/2ML IV SOLN
10.0000 mg | Freq: Once | INTRAVENOUS | Status: AC
  Administered 2022-10-07: 10 mg via INTRAVENOUS

## 2022-10-07 MED ORDER — FENTANYL CITRATE (PF) 100 MCG/2ML IJ SOLN: 25.0000 ug | INTRAMUSCULAR | Status: DC | PRN

## 2022-10-07 MED ORDER — ONDANSETRON HCL 4 MG/2ML IJ SOLN: 4.0000 mg | Freq: Once | INTRAMUSCULAR | Status: DC | PRN

## 2022-10-07 MED ORDER — MIDAZOLAM HCL 2 MG/2ML IJ SOLN
INTRAMUSCULAR | Status: DC | PRN
  Administered 2022-10-07: 2 mg via INTRAVENOUS

## 2022-10-07 MED ORDER — FENTANYL CITRATE (PF) 250 MCG/5ML IJ SOLN
INTRAMUSCULAR | Status: AC
  Filled 2022-10-07: qty 5

## 2022-10-07 MED ORDER — GABAPENTIN 300 MG PO CAPS
300.0000 mg | ORAL_CAPSULE | ORAL | Status: AC
  Administered 2022-10-07: 300 mg via ORAL
  Filled 2022-10-07: qty 1

## 2022-10-07 MED ORDER — PHENYLEPHRINE 80 MCG/ML (10ML) SYRINGE FOR IV PUSH (FOR BLOOD PRESSURE SUPPORT)
PREFILLED_SYRINGE | INTRAVENOUS | Status: AC
  Filled 2022-10-07: qty 10

## 2022-10-07 MED ORDER — LIDOCAINE 2% (20 MG/ML) 5 ML SYRINGE
INTRAMUSCULAR | Status: AC
  Filled 2022-10-07: qty 5

## 2022-10-07 MED ORDER — PROPOFOL 10 MG/ML IV BOLUS
INTRAVENOUS | Status: AC
  Filled 2022-10-07: qty 20

## 2022-10-07 SURGICAL SUPPLY — 35 items
APPLIER CLIP 5 13 M/L LIGAMAX5 (MISCELLANEOUS) ×1
BAG COUNTER SPONGE SURGICOUNT (BAG) ×1 IMPLANT
CANISTER SUCT 3000ML PPV (MISCELLANEOUS) ×1 IMPLANT
CHLORAPREP W/TINT 26 (MISCELLANEOUS) ×1 IMPLANT
CLIP APPLIE 5 13 M/L LIGAMAX5 (MISCELLANEOUS) ×1 IMPLANT
COVER SURGICAL LIGHT HANDLE (MISCELLANEOUS) ×1 IMPLANT
DERMABOND ADVANCED .7 DNX12 (GAUZE/BANDAGES/DRESSINGS) ×1 IMPLANT
ELECT REM PT RETURN 9FT ADLT (ELECTROSURGICAL) ×1
ELECTRODE REM PT RTRN 9FT ADLT (ELECTROSURGICAL) ×1 IMPLANT
GLOVE BIO SURGEON STRL SZ7 (GLOVE) IMPLANT
GLOVE BIOGEL PI IND STRL 6 (GLOVE) ×1 IMPLANT
GLOVE BIOGEL PI IND STRL 7.5 (GLOVE) IMPLANT
GLOVE BIOGEL PI MICRO STRL 5.5 (GLOVE) ×1 IMPLANT
GOWN STRL REUS W/ TWL LRG LVL3 (GOWN DISPOSABLE) ×3 IMPLANT
GOWN STRL REUS W/TWL LRG LVL3 (GOWN DISPOSABLE) ×4
KIT BASIN OR (CUSTOM PROCEDURE TRAY) ×1 IMPLANT
KIT TURNOVER KIT B (KITS) ×1 IMPLANT
L-HOOK LAP DISP 36CM (ELECTROSURGICAL) ×1
LHOOK LAP DISP 36CM (ELECTROSURGICAL) ×1 IMPLANT
NS IRRIG 1000ML POUR BTL (IV SOLUTION) ×1 IMPLANT
PAD ARMBOARD 7.5X6 YLW CONV (MISCELLANEOUS) ×1 IMPLANT
PENCIL BUTTON HOLSTER BLD 10FT (ELECTRODE) ×1 IMPLANT
SCISSORS LAP 5X35 DISP (ENDOMECHANICALS) ×1 IMPLANT
SET IRRIG TUBING LAPAROSCOPIC (IRRIGATION / IRRIGATOR) ×1 IMPLANT
SET TUBE SMOKE EVAC HIGH FLOW (TUBING) ×1 IMPLANT
SLEEVE ENDOPATH XCEL 5M (ENDOMECHANICALS) ×2 IMPLANT
SUT MNCRL AB 4-0 PS2 18 (SUTURE) ×1 IMPLANT
SYS BAG RETRIEVAL 10MM (BASKET) ×1
SYSTEM BAG RETRIEVAL 10MM (BASKET) IMPLANT
TOWEL GREEN STERILE (TOWEL DISPOSABLE) ×1 IMPLANT
TRAY LAPAROSCOPIC MC (CUSTOM PROCEDURE TRAY) ×1 IMPLANT
TROCAR XCEL BLUNT TIP 100MML (ENDOMECHANICALS) ×1 IMPLANT
TROCAR Z-THREAD OPTICAL 5X100M (TROCAR) ×1 IMPLANT
WARMER LAPAROSCOPE (MISCELLANEOUS) ×1 IMPLANT
WATER STERILE IRR 1000ML POUR (IV SOLUTION) ×1 IMPLANT

## 2022-10-07 NOTE — Interval H&P Note (Signed)
History and Physical Interval Note:  10/07/2022 7:38 AM  Heather Buchanan  has presented today for surgery, with the diagnosis of BILIARY DYSKINESIA.  The various methods of treatment have been discussed with the patient and family. After consideration of risks, benefits and other options for treatment, the patient has consented to  Procedure(s): LAPAROSCOPIC CHOLECYSTECTOMY (N/A) as a surgical intervention.  The patient's history has been reviewed, patient examined, no change in status, stable for surgery.  I have reviewed the patient's chart and labs.  Questions were answered to the patient's satisfaction.     Dwan Bolt

## 2022-10-07 NOTE — Discharge Instructions (Addendum)
[facility] DISCHARGE INSTRUCTIONS  Activity No heavy lifting greater than 15 pounds for 4 weeks after surgery. Ok to shower in 24 hours, but do not bathe or submerge incisions underwater. Do not drive while taking narcotic pain medication.  Wound Care Your incisions are covered with skin glue called Dermabond. This will peel off on its own over time. You may shower and allow warm soapy water to run over your incisions. Gently pat dry. Do not submerge your incision underwater. Monitor your incision for any new redness, tenderness, or drainage.  When to Call us: Fever greater than 100.5 New redness, drainage, or swelling at incision site Severe pain, nausea, or vomiting Jaundice (yellowing of the whites of the eyes or skin)  Follow-up You have an appointment scheduled with Dr. Zenia Resides on October 23, 2022 at 10:20am. This will be at the [facility] Surgery office at [address] [place], [place], [place]. Please arrive at least 15 minutes prior to your scheduled appointment time.  For questions or concerns, please call the office at [phone].

## 2022-10-07 NOTE — Transfer of Care (Signed)
Immediate Anesthesia Transfer of Care Note  Patient: Heather Buchanan  Procedure(s) Performed: LAPAROSCOPIC CHOLECYSTECTOMY (Abdomen)  Patient Location: PACU  Anesthesia Type:General  Level of Consciousness: drowsy and patient cooperative  Airway & Oxygen Therapy: Patient Spontanous Breathing  Post-op Assessment: Report given to RN and Post -op Vital signs reviewed and stable  Post vital signs: Reviewed and stable  Last Vitals:  Vitals Value Taken Time  BP 171/93 10/07/22 1006  Temp 36.2 C 10/07/22 1005  Pulse 80 10/07/22 1007  Resp 25 10/07/22 1007  SpO2 91 % 10/07/22 1007  Vitals shown include unvalidated device data.  Last Pain:  Vitals:   10/07/22 0745  TempSrc:   PainSc: 6          Complications: No notable events documented.

## 2022-10-07 NOTE — Anesthesia Postprocedure Evaluation (Signed)
Anesthesia Post Note  Patient: [name]  Procedure(s) Performed: LAPAROSCOPIC CHOLECYSTECTOMY (Abdomen)     Patient location during evaluation: PACU Anesthesia Type: General Level of consciousness: awake and alert Pain management: pain level controlled Vital Signs Assessment: post-procedure vital signs reviewed and stable Respiratory status: spontaneous breathing, nonlabored ventilation, respiratory function stable and patient connected to nasal cannula oxygen Cardiovascular status: blood pressure returned to baseline and stable Postop Assessment: no apparent nausea or vomiting Anesthetic complications: no  No notable events documented.  Last Vitals:  Vitals:   10/07/22 1005 10/07/22 1015  BP: (!) 171/93 (!) 155/91  Pulse: 88 88  Resp: 12 14  Temp: (!) 36.2 C   SpO2: 97% 95%    Last Pain:  Vitals:   10/07/22 1005  TempSrc:   PainSc: Asleep                 Larnce Schnackenberg S

## 2022-10-07 NOTE — Op Note (Signed)
Date: 10/07/22  Patient: Heather Buchanan MRN: [id number]  Preoperative Diagnosis: Biliary dyskinesia Postoperative Diagnosis: Same  Procedure: Laparoscopic cholecystectomy  Surgeon: Michaelle Birks, MD Assistant: Lossie Faes, MD (Resident)  EBL: Minimal  Anesthesia: General endotracheal  Specimens: Gallbladder  Indications: [name] is a 56 yo female who was referred for evaluation of epigastric and RUQ abdominal pain, which gets worse after eating. She has had extensive workup including CT scan and EGD, which were unremarkable. A HIDA showed a reduced gallbladder EF of 14%. After a discussion of the risks and benefits of surgery, she agreed to proceed with cholecystectomy.  Findings: No signs of acute cholecystitis.  Procedure details: Informed consent was obtained in the preoperative area prior to the procedure. The patient was brought to the operating room and placed on the table in the supine position. General anesthesia was induced and appropriate lines and drains were placed for intraoperative monitoring. Perioperative antibiotics were administered per SCIP guidelines. The abdomen was prepped and draped in the usual sterile fashion. A pre-procedure timeout was taken verifying patient identity, surgical site and procedure to be performed.  A small infraumbilical skin incision was made, the subcutaneous tissue was divided with cautery, and the umbilical stalk was grasped and elevated. The fascia was incised and the peritoneal cavity was directly visualized. A 74mm Hassan trocar was placed and the abdomen was insufflated. The peritoneal cavity was inspected with no evidence of visceral or vascular injury. Three 17mm ports were placed in the right subcostal margin, all under direct visualization. The fundus of the gallbladder was grasped and retracted cephalad. The infundibulum was retracted laterally. The cystic triangle was dissected out using cautery and blunt dissection, and the  critical view of safety was obtained. The cystic duct and cystic artery were clipped and ligated, leaving two clips behind on the cystic duct stump. The gallbladder was taken off the liver using cautery. A posterior branch of the cystic artery was clipped during this dissection. The specimen was completely taken off the liver and placed in an endocatch bag. The surgical site was irrigated with saline until the effluent was clear. Hemostasis was achieved in the gallbladder fossa using cautery. The cystic duct and artery stumps were visually inspected and there was no evidence of bile leak. There was some oozing noted on the posterior branch of the cystic artery, and an additional clip was placed. This achieved hemostasis. The ports were removed under direct visualization and the abdomen was desufflated. The specimen was extracted via the umbilical port site. The umbilical port site fascia was closed with a 0 vicryl suture. The skin at all port sites was closed with 4-0 monocryl subcuticular suture. Dermabond was applied.  The patient tolerated the procedure well with no apparent complications. All counts were correct x2 at the end of the procedure. The patient was extubated and taken to PACU in stable condition.  Michaelle Birks, MD 10/07/22 9:59 AM

## 2022-10-07 NOTE — Anesthesia Preprocedure Evaluation (Addendum)
Anesthesia Evaluation  Patient identified by MRN, date of birth, ID band Patient awake    Reviewed: Allergy & Precautions, H&P , NPO status , Patient's Chart, lab work & pertinent test results  Airway Mallampati: II  TM Distance: >3 FB Neck ROM: Full    Dental no notable dental hx.    Pulmonary neg pulmonary ROS   Pulmonary exam normal breath sounds clear to auscultation       Cardiovascular Normal cardiovascular exam Rhythm:Regular Rate:Normal  Untreated HTN   Neuro/Psych negative neurological ROS  negative psych ROS   GI/Hepatic negative GI ROS, Neg liver ROS,,,  Endo/Other  Hypothyroidism    Renal/GU negative Renal ROS  negative genitourinary   Musculoskeletal negative musculoskeletal ROS (+)    Abdominal   Peds negative pediatric ROS (+)  Hematology negative hematology ROS (+)   Anesthesia Other Findings   Reproductive/Obstetrics negative OB ROS                             Anesthesia Physical Anesthesia Plan  ASA: 2  Anesthesia Plan: General   Post-op Pain Management: Tylenol PO (pre-op)*   Induction: Intravenous  PONV Risk Score and Plan: 3 and Ondansetron, Dexamethasone, Midazolam and Treatment may vary due to age or medical condition  Airway Management Planned: Oral ETT  Additional Equipment:   Intra-op Plan:   Post-operative Plan: Extubation in OR  Informed Consent: I have reviewed the patients History and Physical, chart, labs and discussed the procedure including the risks, benefits and alternatives for the proposed anesthesia with the patient or authorized representative who has indicated his/her understanding and acceptance.     Dental advisory given  Plan Discussed with: CRNA and Surgeon  Anesthesia Plan Comments:        Anesthesia Quick Evaluation

## 2022-10-07 NOTE — Anesthesia Procedure Notes (Signed)
Procedure Name: Intubation Date/Time: 10/07/2022 8:40 AM  Performed by: Janene Harvey, CRNAPre-anesthesia Checklist: Patient identified, Emergency Drugs available, Suction available and Patient being monitored Patient Re-evaluated:Patient Re-evaluated prior to induction Oxygen Delivery Method: Circle system utilized Preoxygenation: Pre-oxygenation with 100% oxygen Induction Type: IV induction Ventilation: Mask ventilation without difficulty Laryngoscope Size: Mac and 4 Grade View: Grade II Tube type: Oral Tube size: 7.0 mm Number of attempts: 1 Airway Equipment and Method: Stylet and Oral airway Placement Confirmation: ETT inserted through vocal cords under direct vision, positive ETCO2 and breath sounds checked- equal and bilateral Secured at: 21 cm Tube secured with: Tape Dental Injury: Teeth and Oropharynx as per pre-operative assessment

## 2022-10-08 ENCOUNTER — Encounter (HOSPITAL_COMMUNITY): Payer: Self-pay | Admitting: Surgery

## 2022-10-08 LAB — SURGICAL PATHOLOGY

## 2023-01-06 ENCOUNTER — Telehealth: Payer: Self-pay | Admitting: Gastroenterology

## 2023-01-06 MED ORDER — PANTOPRAZOLE SODIUM 40 MG PO TBEC: 40.0000 mg | DELAYED_RELEASE_TABLET | Freq: Every day | ORAL | 3 refills | Status: AC

## 2023-01-06 NOTE — Telephone Encounter (Signed)
Script sent to pharmacy.

## 2023-01-06 NOTE — Telephone Encounter (Signed)
Patient called would like to know if she is still suppose to take Protonix medication.

## 2024-07-22 ENCOUNTER — Other Ambulatory Visit: Payer: Self-pay | Admitting: Obstetrics and Gynecology

## 2024-07-22 DIAGNOSIS — R928 Other abnormal and inconclusive findings on diagnostic imaging of breast: Secondary | ICD-10-CM
